# Patient Record
Sex: Male | Born: 1985 | Race: White | Hispanic: No | Marital: Married | Smoking: Former smoker
Health system: Southern US, Community
[De-identification: ages and names within clinical notes are randomized; demographics above are authoritative.]

## PROBLEM LIST (undated history)

## (undated) DIAGNOSIS — Z87442 Personal history of urinary calculi: Secondary | ICD-10-CM

## (undated) DIAGNOSIS — G43909 Migraine, unspecified, not intractable, without status migrainosus: Secondary | ICD-10-CM

## (undated) DIAGNOSIS — F419 Anxiety disorder, unspecified: Secondary | ICD-10-CM

## (undated) DIAGNOSIS — I491 Atrial premature depolarization: Secondary | ICD-10-CM

## (undated) DIAGNOSIS — N200 Calculus of kidney: Secondary | ICD-10-CM

## (undated) DIAGNOSIS — F3289 Other specified depressive episodes: Secondary | ICD-10-CM

## (undated) DIAGNOSIS — S060X9A Concussion with loss of consciousness of unspecified duration, initial encounter: Secondary | ICD-10-CM

## (undated) DIAGNOSIS — S060XAA Concussion with loss of consciousness status unknown, initial encounter: Secondary | ICD-10-CM

## (undated) DIAGNOSIS — F329 Major depressive disorder, single episode, unspecified: Secondary | ICD-10-CM

## (undated) HISTORY — DX: Major depressive disorder, single episode, unspecified: F32.9

## (undated) HISTORY — DX: Calculus of kidney: N20.0

## (undated) HISTORY — PX: CHOLECYSTECTOMY: SHX55

## (undated) HISTORY — PX: SURGERY SCROTAL / TESTICULAR: SUR1316

## (undated) HISTORY — PX: APPENDECTOMY: SHX54

## (undated) HISTORY — DX: Concussion with loss of consciousness of unspecified duration, initial encounter: S06.0X9A

## (undated) HISTORY — DX: Other specified depressive episodes: F32.89

---
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
## 2009-04-13 ENCOUNTER — Emergency Department (HOSPITAL_COMMUNITY): Admission: EM | Admit: 2009-04-13 | Discharge: 2009-04-13 | Payer: Self-pay | Admitting: Emergency Medicine

## 2009-07-05 ENCOUNTER — Observation Stay (HOSPITAL_COMMUNITY): Admission: EM | Admit: 2009-07-05 | Discharge: 2009-07-06 | Payer: Self-pay | Admitting: Emergency Medicine

## 2009-09-03 HISTORY — PX: APPENDECTOMY: SHX54

## 2010-05-20 ENCOUNTER — Emergency Department (HOSPITAL_COMMUNITY): Admission: EM | Admit: 2010-05-20 | Discharge: 2010-05-20 | Payer: Self-pay | Admitting: Emergency Medicine

## 2010-10-27 ENCOUNTER — Encounter: Payer: Self-pay | Admitting: Internal Medicine

## 2010-10-27 ENCOUNTER — Other Ambulatory Visit: Payer: Self-pay | Admitting: Internal Medicine

## 2010-10-27 ENCOUNTER — Ambulatory Visit (INDEPENDENT_AMBULATORY_CARE_PROVIDER_SITE_OTHER): Payer: Self-pay | Admitting: Internal Medicine

## 2010-10-27 DIAGNOSIS — R5381 Other malaise: Secondary | ICD-10-CM

## 2010-10-27 DIAGNOSIS — F329 Major depressive disorder, single episode, unspecified: Secondary | ICD-10-CM | POA: Insufficient documentation

## 2010-10-27 DIAGNOSIS — R5383 Other fatigue: Secondary | ICD-10-CM

## 2010-10-27 DIAGNOSIS — R7309 Other abnormal glucose: Secondary | ICD-10-CM | POA: Insufficient documentation

## 2010-10-27 DIAGNOSIS — E291 Testicular hypofunction: Secondary | ICD-10-CM

## 2010-10-27 DIAGNOSIS — F3289 Other specified depressive episodes: Secondary | ICD-10-CM | POA: Insufficient documentation

## 2010-10-27 LAB — CONVERTED CEMR LAB
Testosterone Free: 79 pg/mL (ref 47.0–244.0)
Testosterone-% Free: 2 % (ref 1.6–2.9)
Testosterone: 390.11 ng/dL (ref 250–890)

## 2010-10-31 LAB — CONVERTED CEMR LAB: Vit D, 25-Hydroxy: 25 ng/mL — ABNORMAL LOW (ref 30–89)

## 2010-10-31 NOTE — Assessment & Plan Note (Signed)
Summary: for testosterone level check   Vital Signs:  Patient profile:   25 year old male Height:      69.5 inches Weight:      215.38 pounds BMI:     31.46 Pulse rate:   80 / minute Pulse rhythm:   regular BP sitting:   122 / 86  (left arm) Cuff size:   regular  Vitals Entered By: Army Fossa CMA (October 27, 2010 11:27 AM) CC: New to est- check testerone level- fasting  Comments [employer]er, [employer] [address]  declines tdap   History of Present Illness:  new patient   6 month history of very low energy , severely decrease libido , generalized weakness. [facility] provided  did some blood work, reportedly the BMP, TSH, were normal.   cholesterol was slightly elevated,  A1 C. was 5.6   was recommended to have a  testosterone level checked.  ROS  denies chest pain or shortness of breath No LE edama  his weight has increase here lately  the need of shaving has not changed, he never require frequent shaving.  he takes multiple medications for depression, no new medications, his depression is relatively well-controlled  (reports  it has been never 100% controlled)  Preventive Screening-Counseling & Management  Alcohol-Tobacco     Smoking Status: quit  Caffeine-Diet-Exercise     Does Patient Exercise: yes  Past History:  Family History: Last updated: 10/27/2010 High cholesterol-- + DM--no CAD-- GF MI in his late 22s  colon ca--no prostate ca--no  Social History: Last updated: 10/27/2010 Divorced 2011 living w/  parents  1 child  Former Smoker Alcohol use-yes Regular exercise-yes occupation-- unemployed education --HS plus almost 4 years of college  Risk Factors: Exercise: yes (10/27/2010)  Risk Factors: Smoking Status: quit (10/27/2010)  Past Medical History: Depression, chronic, moderate-severe, sees Mental Health  Past Surgical History:  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   slightly overweight Neck:  no masses and no thyromegaly.   Lungs:  normal respiratory effort, no intercostal retractions, no accessory muscle use, normal breath sounds, and no dullness.   Heart:  normal rate, regular rhythm, no murmur, and no gallop.   Abdomen:  soft, non-tender, no distention, and no inguinal hernia.   Genitalia:  circumcised, no hydrocele, no varicocele, and no scrotal masses.   right testicle is about half of the size compared to the left. no mass or tenderness.  No  lesions or urethral discharge   Impression & Recommendations:  Problem # 1:  FATIGUE (ICD-780.79)   fatigue and decreased libido  for 6 months  He takes a number of medications that could account for these issues however his medications have not changed in a while.  plan  patient will bring copies of all the previous labs  check testosterone level, vitamin D, B12. reassess in 6 months  Orders: Venipuncture (16109) TLB-B12 + Folate Pnl (82746_82607-B12/FOL) T-Vitamin D (25-Hydroxy) (60454-09811) Specimen Handling (91478)  Problem # 2:  DIABETES MELLITUS, BORDERLINE (ICD-790.29)  A1c  recently was 5.6. Encourage exercise, diet and lose weight. Information provided. Reassess in 6 months  Problem # 3:  ? of HYPOGONADISM (ICD-257.2) see #1 has a small R testicle---STE encouraged   Complete Medication List: 1)  Celexa 40 Mg Tabs (Citalopram hydrobromide) .Marland Kitchen.. 1 by mouth qd 2)  Novne 2mg   .... Once daily 3)  Cogentin  .... Unsure of dosage 4)  Klonopin  0.5 Mg Tabs (Clonazepam) .Marland Kitchen.. 1 as needed 5)  Energy Booster Pack (Multiple vitamins-minerals) .... Two times a day 6)  Remeron 15 Mg Tabs (Mirtazapine) .Marland Kitchen.. 1 by mouth at bedtime  Patient Instructions: 1)  Please schedule a follow-up appointment in 6 months .    Orders Added: 1)  Venipuncture [36415] 2)  TLB-B12 + Folate Pnl [82746_82607-B12/FOL] 3)  T-Vitamin D (25-Hydroxy) [16109-60454] 4)  Specimen Handling [99000] 5)  New Patient Level II [99202]     Risk Factors:  Tobacco use:  quit Alcohol use:  yes Exercise:  yes

## 2010-11-05 IMAGING — CR DG ABDOMEN ACUTE W/ 1V CHEST
4 series · 4 of 4 positions shown · non-contrast
Comparison: None

CLINICAL DATA: Severe right-sided abdominal pain, appendectomy 2
weeks ago

ACUTE ABDOMEN SERIES (ABDOMEN 2 VIEW & CHEST 1 VIEW)

[w chest pa]
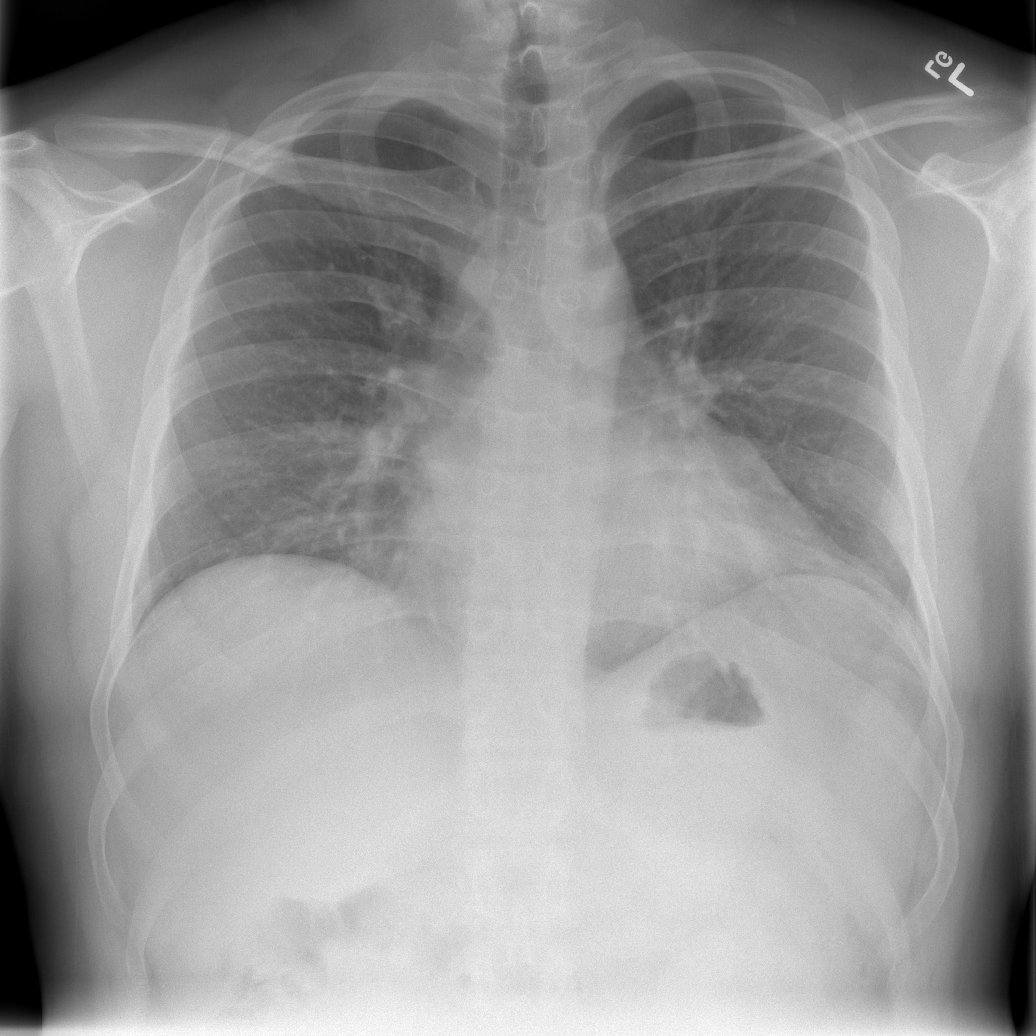

[w abdomen upright *]
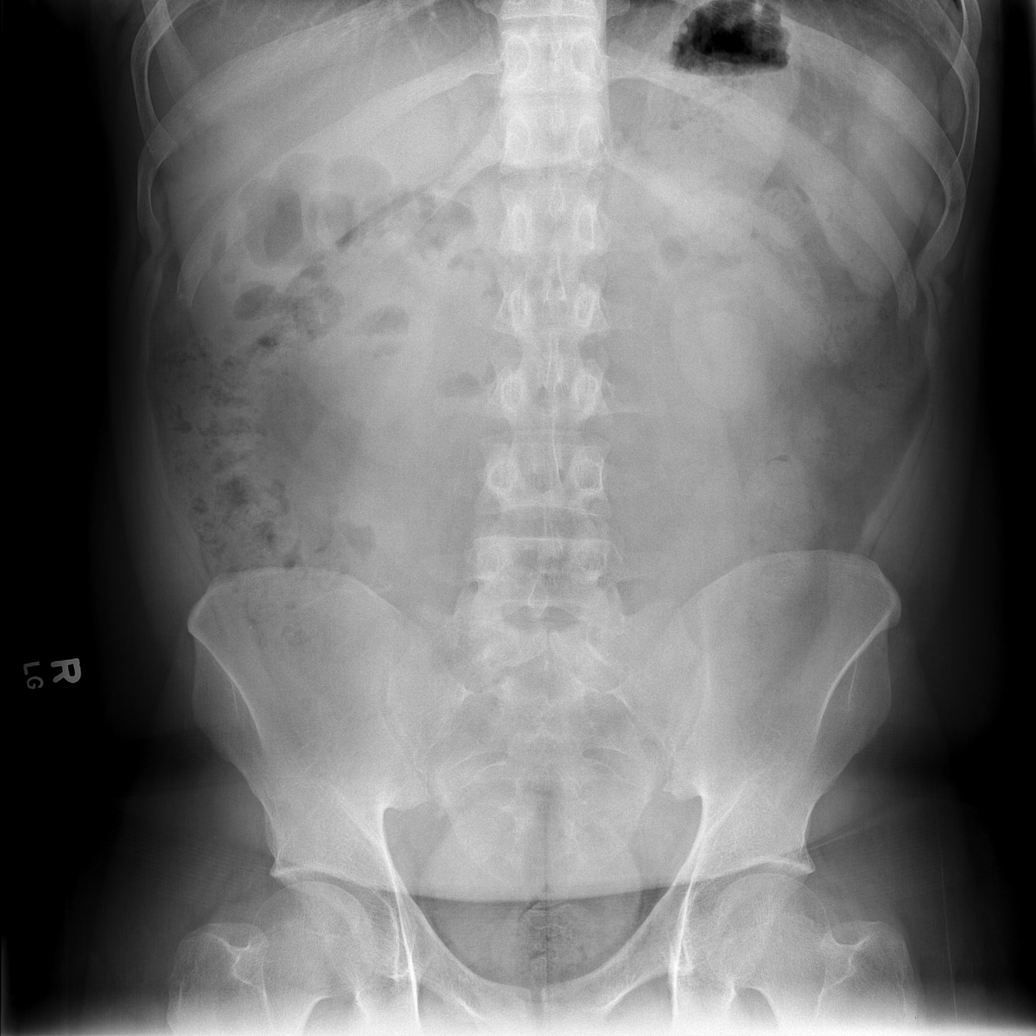

[t abdomen supine (1 of 2)]
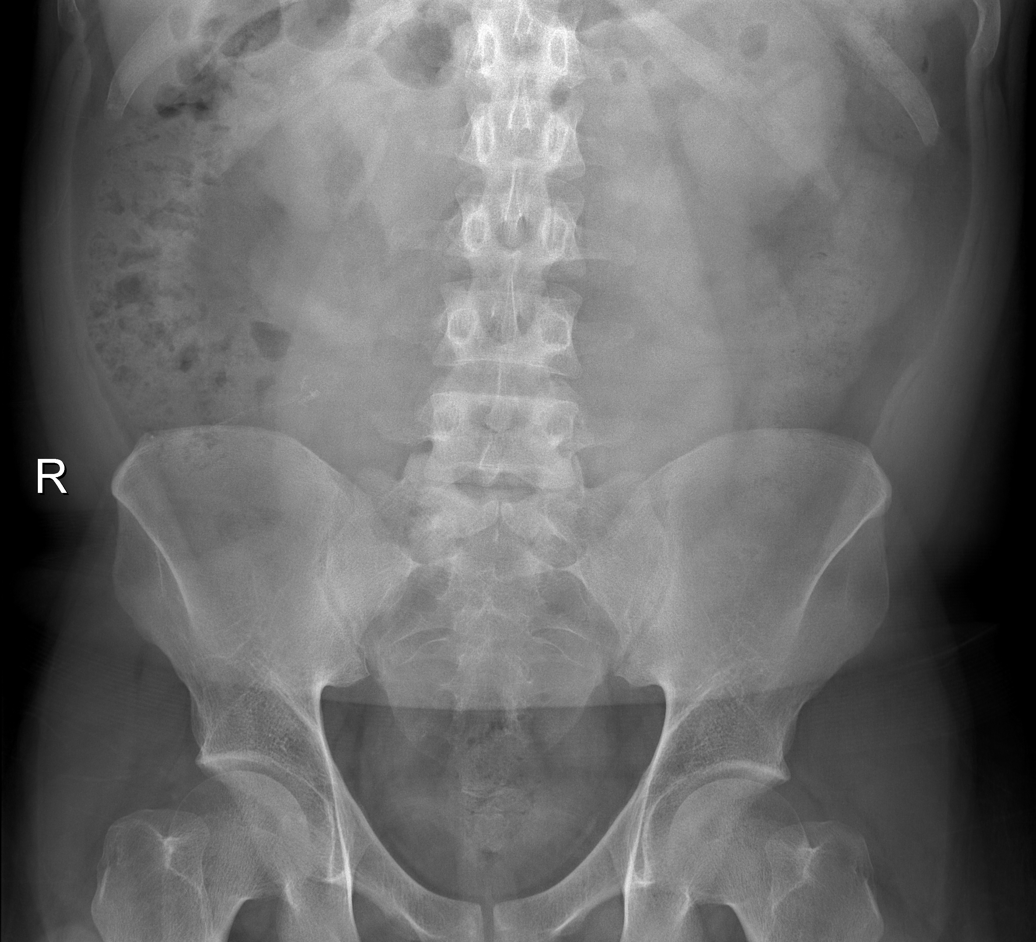

[t abdomen supine (2 of 2)]
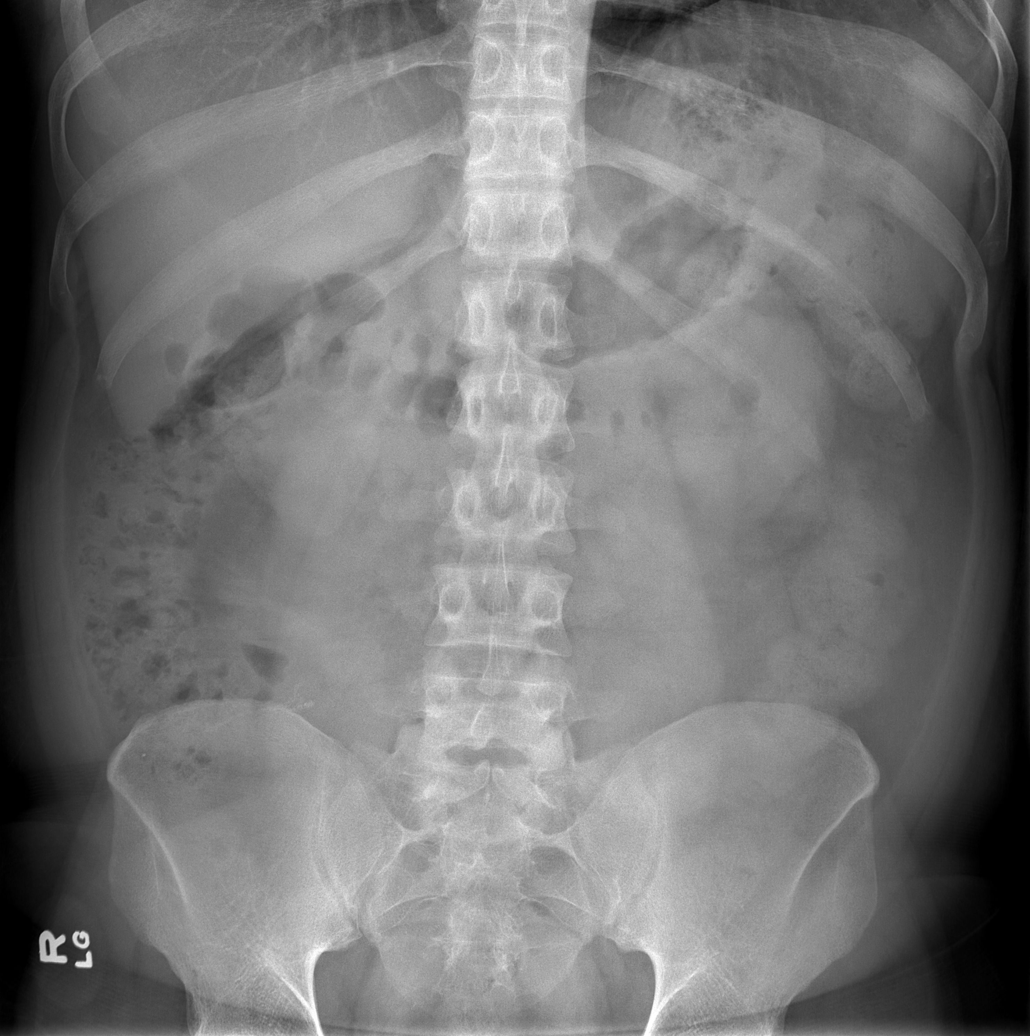

[4 of 4 positions shown; findings below may reference images not displayed]

FINDINGS: The lungs are clear.  The heart is within upper limits of
normal.  No bony abnormality is seen.

Supine and erect views of the abdomen show no bowel obstruction.
No free air is seen.  Surgical sutures are noted in the right lower
quadrant most likely due to recent appendectomy.  No opaque
calculus is seen.
IMPRESSION: 1.  No active lung disease.
2.  No bowel obstruction.  No free air.

## 2010-11-16 LAB — DIFFERENTIAL
Basophils Absolute: 0 10*3/uL (ref 0.0–0.1)
Eosinophils Relative: 3 % (ref 0–5)
Lymphocytes Relative: 26 % (ref 12–46)
Neutro Abs: 5.8 10*3/uL (ref 1.7–7.7)
Neutrophils Relative %: 65 % (ref 43–77)

## 2010-11-16 LAB — BASIC METABOLIC PANEL
BUN: 5 mg/dL — ABNORMAL LOW (ref 6–23)
Calcium: 9 mg/dL (ref 8.4–10.5)
Creatinine, Ser: 0.87 mg/dL (ref 0.4–1.5)
GFR calc non Af Amer: >60 mL/min (ref >60)

## 2010-11-16 LAB — CBC
Platelets: 233 10*3/uL (ref 150–400)
RDW: 13 % (ref 11.5–15.5)
WBC: 9 10*3/uL (ref 4.0–10.5)

## 2010-12-09 LAB — DIFFERENTIAL
Lymphocytes Relative: 42 % (ref 12–46)
Monocytes Absolute: 0.7 10*3/uL (ref 0.1–1.0)
Monocytes Relative: 7 % (ref 3–12)
Neutro Abs: 4.5 10*3/uL (ref 1.7–7.7)

## 2010-12-09 LAB — URINALYSIS, ROUTINE W REFLEX MICROSCOPIC
Ketones, ur: NEGATIVE mg/dL
Leukocytes, UA: NEGATIVE
Nitrite: NEGATIVE
Specific Gravity, Urine: 1.015 (ref 1.005–1.030)
pH: 6.5 (ref 5.0–8.0)

## 2010-12-09 LAB — CBC
HCT: 45.7 % (ref 39.0–52.0)
MCHC: 33.7 g/dL (ref 30.0–36.0)
MCV: 90.2 fL (ref 78.0–100.0)
Platelets: 249 10*3/uL (ref 150–400)
RDW: 13.4 % (ref 11.5–15.5)

## 2010-12-09 LAB — COMPREHENSIVE METABOLIC PANEL
Albumin: 4.1 g/dL (ref 3.5–5.2)
BUN: 12 mg/dL (ref 6–23)
Calcium: 9.8 mg/dL (ref 8.4–10.5)
Creatinine, Ser: 0.92 mg/dL (ref 0.4–1.5)
Total Protein: 7.6 g/dL (ref 6.0–8.3)

## 2010-12-09 LAB — POCT I-STAT, CHEM 8
Calcium, Ion: 1.21 mmol/L (ref 1.12–1.32)
Chloride: 100 mEq/L (ref 96–112)
Glucose, Bld: 92 mg/dL (ref 70–99)
HCT: 48 % (ref 39.0–52.0)

## 2010-12-09 LAB — URINE MICROSCOPIC-ADD ON

## 2013-04-28 ENCOUNTER — Encounter (HOSPITAL_COMMUNITY): Payer: Self-pay | Admitting: Emergency Medicine

## 2013-04-28 ENCOUNTER — Emergency Department (HOSPITAL_COMMUNITY)
Admission: EM | Admit: 2013-04-28 | Discharge: 2013-04-28 | Disposition: A | Discharge status: Home / Self Care | Payer: Self-pay | Attending: Emergency Medicine | Admitting: Emergency Medicine

## 2013-04-28 ENCOUNTER — Emergency Department (HOSPITAL_COMMUNITY): Payer: Self-pay

## 2013-04-28 DIAGNOSIS — R5381 Other malaise: Secondary | ICD-10-CM | POA: Insufficient documentation

## 2013-04-28 DIAGNOSIS — R002 Palpitations: Secondary | ICD-10-CM | POA: Insufficient documentation

## 2013-04-28 DIAGNOSIS — I4949 Other premature depolarization: Secondary | ICD-10-CM | POA: Insufficient documentation

## 2013-04-28 DIAGNOSIS — Z79899 Other long term (current) drug therapy: Secondary | ICD-10-CM | POA: Insufficient documentation

## 2013-04-28 DIAGNOSIS — R42 Dizziness and giddiness: Secondary | ICD-10-CM | POA: Insufficient documentation

## 2013-04-28 DIAGNOSIS — Z87891 Personal history of nicotine dependence: Secondary | ICD-10-CM | POA: Insufficient documentation

## 2013-04-28 DIAGNOSIS — I493 Ventricular premature depolarization: Secondary | ICD-10-CM

## 2013-04-28 HISTORY — DX: Atrial premature depolarization: I49.1

## 2013-04-28 LAB — CBC
Hemoglobin: 15.9 g/dL (ref 13.0–17.0)
MCH: 30.1 pg (ref 26.0–34.0)
MCV: 85.8 fL (ref 78.0–100.0)
RBC: 5.29 MIL/uL (ref 4.22–5.81)

## 2013-04-28 LAB — POCT I-STAT TROPONIN I: Troponin i, poc: 0 ng/mL (ref 0.00–0.08)

## 2013-04-28 LAB — BASIC METABOLIC PANEL
CO2: 28 mEq/L (ref 19–32)
Calcium: 9.8 mg/dL (ref 8.4–10.5)
Chloride: 99 mEq/L (ref 96–112)
Creatinine, Ser: 0.84 mg/dL (ref 0.50–1.35)
Glucose, Bld: 98 mg/dL (ref 70–99)
Sodium: 137 mEq/L (ref 135–145)

## 2013-04-28 NOTE — ED Notes (Signed)
Pt c/o irregular heart beat and increasing dizziness x3 hours.  Reports hx of PAC.

## 2013-04-28 NOTE — ED Provider Notes (Signed)
CSN: [id number]     Arrival date & time 04/28/13  1303 History   First MD Initiated Contact with Patient 04/28/13 1503     Chief Complaint  Patient presents with  . Irregular Heart Beat  . Dizziness   (Consider location/radiation/quality/duration/timing/severity/associated sxs/prior Treatment) HPI Patient is a 27 year old male who presented to emergency department complaining of weakness, dizziness, palpitations. Patient states his symptoms began a few days ago but worsened today. Patient states he feels like his heart is skipping. Patient states he has history of palpitations in the past, and states was seen in the hospital was told he has PACs. Patient states at that time he was started to be related to his illicit drug use. 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 States that he went to rehabilitation and denies taking any prescribed or nonprescribed medications. Patient also denies any drug use, alcohol, smoking. Patient admits to taking a multivitamin and fish oil daily. Patient states he drinks about 28 ounces coffee cups a day. Patient denies any stress or history of anxiety. Past Medical History  Diagnosis Date  . PAC (premature atrial contraction)    History reviewed. No pertinent past surgical history. History reviewed. No pertinent family history. History  Substance Use Topics  . Smoking status: Former Games developer  . Smokeless tobacco: Not on file  . Alcohol Use: No    Review of Systems  Constitutional: Negative for fever and chills.  HENT: Negative for neck pain and neck stiffness.   Respiratory: Negative for cough, chest tightness and shortness of breath.   Cardiovascular: Positive for palpitations. Negative for chest pain and leg swelling.  Gastrointestinal: Negative for nausea, vomiting, abdominal pain, diarrhea and abdominal distention.  Genitourinary: Negative for dysuria, urgency, frequency and hematuria.  Musculoskeletal: Negative for myalgias and  arthralgias.  Skin: Negative for rash.  Allergic/Immunologic: Negative for immunocompromised state.  Neurological: Positive for dizziness, weakness and light-headedness. Negative for numbness and headaches.    Allergies  Review of patient's allergies indicates no known allergies.  Home Medications   Current Outpatient Rx  Name  Route  Sig  Dispense  Refill  . fish oil-omega-3 fatty acids 1000 MG capsule   Oral   Take 2 g by mouth daily.         Marland Kitchen ibuprofen (ADVIL,MOTRIN) 200 MG tablet   Oral   Take 400 mg by mouth every 6 (six) hours as needed for pain.         . Multiple Vitamin (MULTIVITAMIN WITH MINERALS) TABS tablet   Oral   Take 1 tablet by mouth daily.          BP 153/91  Pulse 61  Resp 16  Ht 5\' 10"  (1.778 m)  Wt 200 lb (90.719 kg)  BMI 28.7 kg/m2  SpO2 98% Physical Exam  Nursing note and vitals reviewed. Constitutional: He appears well-developed and well-nourished. No distress.  HENT:  Head: Normocephalic and atraumatic.  Eyes: Conjunctivae are normal.  Neck: Neck supple.  Cardiovascular: Normal rate, regular rhythm and normal heart sounds.   Pulmonary/Chest: Effort normal. No respiratory distress. He has no wheezes. He has no rales.  Abdominal: Soft. Bowel sounds are normal. He exhibits no distension. There is no tenderness. There is no rebound.  Musculoskeletal: He exhibits no edema.  Neurological: He is alert.  Skin: Skin is warm and dry.    ED Course  Procedures (including critical care time) Labs Review Results for orders placed during the hospital encounter of 04/28/13  CBC      Result Value Range   WBC 5.9  4.0 - 10.5 K/uL   RBC 5.29  4.22 - 5.81 MIL/uL   Hemoglobin 15.9  13.0 - 17.0 g/dL   HCT 14.7  82.9 - 56.2 %   MCV 85.8  78.0 - 100.0 fL   MCH 30.1  26.0 - 34.0 pg   MCHC 35.0  30.0 - 36.0 g/dL   RDW 13.0  86.5 - 78.4 %   Platelets 200  150 - 400 K/uL  BASIC METABOLIC PANEL      Result Value Range   Sodium 137  135 - 145 mEq/L    Potassium 3.8  3.5 - 5.1 mEq/L   Chloride 99  96 - 112 mEq/L   CO2 28  19 - 32 mEq/L   Glucose, Bld 98  70 - 99 mg/dL   BUN 13  6 - 23 mg/dL   Creatinine, Ser 6.96  0.50 - 1.35 mg/dL   Calcium 9.8  8.4 - 29.5 mg/dL   GFR calc non Af Amer >90  >90 mL/min   GFR calc Af Amer >90  >90 mL/min  MAGNESIUM      Result Value Range   Magnesium 2.2  1.5 - 2.5 mg/dL  POCT I-STAT TROPONIN I      Result Value Range   Troponin i, poc 0.00  0.00 - 0.08 ng/mL   Comment 3            Imaging Review Dg Chest Port 1 View  04/28/2013   *RADIOLOGY REPORT*  Clinical Data: Irregular heart beat.  Dizziness  PORTABLE CHEST - 1 VIEW  Comparison: None.  Findings: Lungs are clear.  Negative for heart failure.  Negative for infiltrate effusion or mass lesion.  IMPRESSION: No active cardiopulmonary abnormality.   Original Report Authenticated By: Janeece Riggers, M.D.    MDM   1. PVCs (premature ventricular contractions)   2. Palpitations   3. Dizziness    Patient with palpitations and dizziness for several days. No drugs or alcohol. History of the same. EKG unremarkable full showing sinus rhythm, 1 PVC. On the monitor patient does have intermittent PVCs otherwise normal sinus rhythm. Labs as above and are normal. I do suspect patient's symptoms are due to his PVCs. At this time no signs of a cardiac problem, VS normal, pt non toxic appearing. Will d/c home with cardiology follow up. Pt agrees with plan. Instructed to avoid caffeine drinks and to return to ER if worsening.         Lottie Mussel, PA-C 04/28/13 1740

## 2013-04-28 NOTE — Progress Notes (Signed)
EDCM approached by ER RN regarding resources for health insurance for patient.  EDCM provided list of pcps who accept selfpay patients, list of discount pharmacies and needymeds.org for medication assistance, information about Medicaid and Affordable Care Act for insurance, and financial assistance in the community such as [employer].  [facility] representative also saw patient and provided information regarding the orange card.

## 2013-04-28 NOTE — ED Provider Notes (Signed)
EKG: Indications ingestion. Interpretation sinus rhythm no interval changes no ST or T wave changes  Claudean Kinds, MD 04/28/13 1523

## 2013-04-28 NOTE — Progress Notes (Signed)
[facility] CL provided patient with a list of primary care resources in [place] and a [facility]-Orange Card application.  °

## 2013-04-29 NOTE — ED Provider Notes (Signed)
Medical screening examination/treatment/procedure(s) were performed by non-physician practitioner and as supervising physician I was immediately available for consultation/collaboration.  Shon Baton, MD 04/29/13 [phone]

## 2013-05-05 ENCOUNTER — Encounter: Payer: Self-pay | Admitting: *Deleted

## 2013-05-05 ENCOUNTER — Encounter: Payer: Self-pay | Admitting: Cardiology

## 2013-05-06 ENCOUNTER — Encounter: Payer: Self-pay | Admitting: Cardiology

## 2013-05-06 ENCOUNTER — Ambulatory Visit (INDEPENDENT_AMBULATORY_CARE_PROVIDER_SITE_OTHER): Payer: Self-pay | Admitting: Cardiology

## 2013-05-06 VITALS — BP 139/78 | HR 77 | Ht 70.0 in | Wt 193.8 lb

## 2013-05-06 DIAGNOSIS — R002 Palpitations: Secondary | ICD-10-CM

## 2013-05-06 DIAGNOSIS — F329 Major depressive disorder, single episode, unspecified: Secondary | ICD-10-CM

## 2013-05-06 MED ORDER — METOPROLOL SUCCINATE ER 25 MG PO TB24: 25.0000 mg | ORAL_TABLET | Freq: Every day | ORAL | Status: DC

## 2013-05-06 NOTE — Patient Instructions (Signed)
Your physician recommends that you schedule a follow-up appointment in: 12 WEEKS WITH DR Jens Som  Your physician has requested that you have an echocardiogram. Echocardiography is a painless test that uses sound waves to create images of your heart. It provides your doctor with information about the size and shape of your heart and how well your heart's chambers and valves are working. This procedure takes approximately one hour. There are no restrictions for this procedure.   Your physician recommends that you HAVE LAB WORK TODAY  START METOPROLOL SUCC 25 MG ONCE DAILY

## 2013-05-06 NOTE — Assessment & Plan Note (Signed)
Management per primary care. 

## 2013-05-06 NOTE — Progress Notes (Signed)
  HPI: 27 year old male for evaluation of palpitations. Seen in the emergency room on August 26 with complaints of palpitations. Troponin negative. Magnesium 2.2. Hemoglobin 15.9. Potassium 3.8. Chest x-ray negative. Patient noted to have PVCs on telemetry and referred to cardiology. Patient states he had palpitations approximately 3 years ago related to PACs. He has recently noticed increasing palpitations again. These are described as a skip. There is associated dizziness and dyspnea but no chest pain. He has not had syncope. He otherwise denies dyspnea on exertion, orthopnea, PND, pedal edema, exertional chest pain. Of note he did have symptoms in the emergency room when he was found to have PVCs on telemetry.  Current Outpatient Prescriptions  Medication Sig Dispense Refill  . fish oil-omega-3 fatty acids 1000 MG capsule Take 2 g by mouth daily.      Marland Kitchen ibuprofen (ADVIL,MOTRIN) 200 MG tablet Take 400 mg by mouth every 6 (six) hours as needed for pain.      . Multiple Vitamin (MULTIVITAMIN WITH MINERALS) TABS tablet Take 1 tablet by mouth daily.       No current facility-administered medications for this visit.    No Known Allergies  Past Medical History  Diagnosis Date  . PAC (premature atrial contraction)   . DEPRESSION   . Nephrolithiasis     Past Surgical History  Procedure Laterality Date  . Appendectomy    . Surgery scrotal / testicular      History   Social History  . Marital Status: Divorced    Spouse Name: N/A    Number of Children: 1  . Years of Education: N/A   Occupational History  . Not on file.   Social History Main Topics  . Smoking status: Former Games developer  . Smokeless tobacco: Not on file  . Alcohol Use: No  . Drug Use: No     Comment: Previous substance abuse  . Sexual Activity: Not on file   Other Topics Concern  . Not on file   Social History Narrative  . No narrative on file    Family History  Problem Relation Age of Onset  . Heart disease  Paternal Grandfather     ROS: no fevers or chills, productive cough, hemoptysis, dysphasia, odynophagia, melena, hematochezia, dysuria, hematuria, rash, seizure activity, orthopnea, PND, pedal edema, claudication. Remaining systems are negative.  Physical Exam:   Blood pressure 139/78, pulse 77, height 5\' 10"  (1.778 m), weight 193 lb 12.8 oz (87.907 kg).  General:  Well developed/well nourished in NAD Skin warm/dry Patient not depressed No peripheral clubbing Back-normal HEENT-normal/normal eyelids Neck supple/normal carotid upstroke bilaterally; no bruits; no JVD; no thyromegaly chest - CTA/ normal expansion CV - RRR/normal S1 and S2; no murmurs, rubs or gallops;  PMI nondisplaced Abdomen -NT/ND, no HSM, no mass, + bowel sounds, no bruit 2+ femoral pulses, no bruits Ext-no edema, chords, 2+ DP Neuro-grossly nonfocal  ECG 04/28/2013-sinus arrhythmia with early repolarization abnormality.

## 2013-05-06 NOTE — Assessment & Plan Note (Addendum)
Symptoms sound to be related to PACs and PVCs as he had these in the emergency room with documentation of PVCs on telemetry. He also has some dyspnea with the symptoms. Schedule echocardiogram to quantify LV function. Check TSH. Add Toprol 25 mg daily. Note potassium and magnesium normal.

## 2013-05-14 ENCOUNTER — Ambulatory Visit (HOSPITAL_COMMUNITY): Payer: Self-pay | Attending: Cardiology | Admitting: Radiology

## 2013-05-14 DIAGNOSIS — Z87891 Personal history of nicotine dependence: Secondary | ICD-10-CM | POA: Insufficient documentation

## 2013-05-14 DIAGNOSIS — R0609 Other forms of dyspnea: Secondary | ICD-10-CM | POA: Insufficient documentation

## 2013-05-14 DIAGNOSIS — R0989 Other specified symptoms and signs involving the circulatory and respiratory systems: Secondary | ICD-10-CM | POA: Insufficient documentation

## 2013-05-14 DIAGNOSIS — R7309 Other abnormal glucose: Secondary | ICD-10-CM | POA: Insufficient documentation

## 2013-05-14 DIAGNOSIS — R002 Palpitations: Secondary | ICD-10-CM

## 2013-05-14 NOTE — Progress Notes (Signed)
Echocardiogram performed.  

## 2013-05-15 ENCOUNTER — Telehealth: Payer: Self-pay | Admitting: Cardiology

## 2013-05-15 NOTE — Telephone Encounter (Signed)
Received call from patient stated he needed a note from Dr.Crenshaw to return to [facility].Stated needed note asap.Patient stated to call father on Monday 05/18/13 cell # [phone].Stated he will pick note up.Message sent to Sherri Rad RN she will be working with Dr.Crenshaw on Monday 05/18/13.

## 2013-05-15 NOTE — Telephone Encounter (Signed)
New Problem  Needs a note from the Dr. to return to [facility]// left the note at front office// checking on the status.

## 2013-05-15 NOTE — Telephone Encounter (Signed)
Returned call to patient no answer.LMTC. 

## 2013-05-18 ENCOUNTER — Encounter: Payer: Self-pay | Admitting: *Deleted

## 2013-05-18 NOTE — Telephone Encounter (Signed)
Reviewed with Dr. Jens Som. The patient may return to [facility]. I have done a letter for the patient. I have left a message for the patient's father to call at the number listed.

## 2013-05-18 NOTE — Telephone Encounter (Signed)
I spoke with the patient's father. He is aware of echo results and that the letter is at the front desk.

## 2013-05-20 ENCOUNTER — Encounter: Payer: Self-pay | Admitting: Cardiology

## 2013-06-08 ENCOUNTER — Encounter: Payer: Self-pay | Admitting: Cardiovascular Disease

## 2013-08-03 ENCOUNTER — Encounter: Payer: Self-pay | Admitting: Cardiology

## 2013-08-03 NOTE — Progress Notes (Signed)
      HPI: FU palpitations. Seen in the emergency room on August 26 with complaints of palpitations. Troponin negative. Magnesium 2.2. Hemoglobin 15.9. Potassium 3.8. Chest x-ray negative. Patient noted to have PVCs on telemetry and referred to cardiology. Echocardiogram in September of 2014 showed normal LV function. TSH normal. When I saw him previously we added Toprol 25 mg daily. Since then,    Current Outpatient Prescriptions  Medication Sig Dispense Refill  . fish oil-omega-3 fatty acids 1000 MG capsule Take 2 g by mouth daily.      Marland Kitchen ibuprofen (ADVIL,MOTRIN) 200 MG tablet Take 400 mg by mouth every 6 (six) hours as needed for pain.      . metoprolol succinate (TOPROL XL) 25 MG 24 hr tablet Take 1 tablet (25 mg total) by mouth daily.  30 tablet  12  . Multiple Vitamin (MULTIVITAMIN WITH MINERALS) TABS tablet Take 1 tablet by mouth daily.       No current facility-administered medications for this visit.     Past Medical History  Diagnosis Date  . PAC (premature atrial contraction)   . DEPRESSION   . Nephrolithiasis     Past Surgical History  Procedure Laterality Date  . Appendectomy    . Surgery scrotal / testicular      History   Social History  . Marital Status: Divorced    Spouse Name: N/A    Number of Children: 1  . Years of Education: N/A   Occupational History  . Not on file.   Social History Main Topics  . Smoking status: Former Games developer  . Smokeless tobacco: Not on file  . Alcohol Use: No  . Drug Use: No     Comment: Previous substance abuse  . Sexual Activity: Not on file   Other Topics Concern  . Not on file   Social History Narrative  . No narrative on file    ROS: no fevers or chills, productive cough, hemoptysis, dysphasia, odynophagia, melena, hematochezia, dysuria, hematuria, rash, seizure activity, orthopnea, PND, pedal edema, claudication. Remaining systems are negative.  Physical Exam: Well-developed well-nourished in no acute  distress.  Skin is warm and dry.  HEENT is normal.  Neck is supple.  Chest is clear to auscultation with normal expansion.  Cardiovascular exam is regular rate and rhythm.  Abdominal exam nontender or distended. No masses palpated. Extremities show no edema. neuro grossly intact  ECG     This encounter was created in error - please disregard.

## 2013-08-07 ENCOUNTER — Encounter: Payer: Self-pay | Admitting: Cardiology

## 2013-10-23 ENCOUNTER — Encounter: Payer: Self-pay | Admitting: Cardiology

## 2015-06-16 ENCOUNTER — Emergency Department (HOSPITAL_COMMUNITY)
Admission: EM | Admit: 2015-06-16 | Discharge: 2015-06-16 | Disposition: A | Discharge status: Home / Self Care | Payer: 59 | Attending: Emergency Medicine | Admitting: Emergency Medicine

## 2015-06-16 ENCOUNTER — Encounter (HOSPITAL_COMMUNITY): Payer: Self-pay | Admitting: Emergency Medicine

## 2015-06-16 DIAGNOSIS — Z79899 Other long term (current) drug therapy: Secondary | ICD-10-CM | POA: Insufficient documentation

## 2015-06-16 DIAGNOSIS — Z87442 Personal history of urinary calculi: Secondary | ICD-10-CM | POA: Diagnosis not present

## 2015-06-16 DIAGNOSIS — R5383 Other fatigue: Secondary | ICD-10-CM | POA: Diagnosis present

## 2015-06-16 DIAGNOSIS — Z8679 Personal history of other diseases of the circulatory system: Secondary | ICD-10-CM | POA: Insufficient documentation

## 2015-06-16 DIAGNOSIS — R42 Dizziness and giddiness: Secondary | ICD-10-CM | POA: Diagnosis not present

## 2015-06-16 DIAGNOSIS — Z8659 Personal history of other mental and behavioral disorders: Secondary | ICD-10-CM | POA: Diagnosis not present

## 2015-06-16 DIAGNOSIS — Z87891 Personal history of nicotine dependence: Secondary | ICD-10-CM | POA: Insufficient documentation

## 2015-06-16 LAB — URINALYSIS, ROUTINE W REFLEX MICROSCOPIC
BILIRUBIN URINE: NEGATIVE
Glucose, UA: NEGATIVE mg/dL
HGB URINE DIPSTICK: NEGATIVE
KETONES UR: NEGATIVE mg/dL
Leukocytes, UA: NEGATIVE
Nitrite: NEGATIVE
Protein, ur: NEGATIVE mg/dL
SPECIFIC GRAVITY, URINE: 1.016 (ref 1.005–1.030)
UROBILINOGEN UA: 0.2 mg/dL (ref 0.0–1.0)
pH: 7 (ref 5.0–8.0)

## 2015-06-16 LAB — BASIC METABOLIC PANEL
Anion gap: 9 (ref 5–15)
BUN: 14 mg/dL (ref 6–20)
CALCIUM: 9.6 mg/dL (ref 8.9–10.3)
CO2: 27 mmol/L (ref 22–32)
CREATININE: 0.97 mg/dL (ref 0.61–1.24)
Chloride: 101 mmol/L (ref 101–111)
GFR calc Af Amer: >60 mL/min (ref >60)
GLUCOSE: 107 mg/dL — AB (ref 65–99)
Potassium: 3.9 mmol/L (ref 3.5–5.1)
SODIUM: 137 mmol/L (ref 135–145)

## 2015-06-16 LAB — CBC
HEMATOCRIT: 46.6 % (ref 39.0–52.0)
Hemoglobin: 16.6 g/dL (ref 13.0–17.0)
MCH: 30.3 pg (ref 26.0–34.0)
MCHC: 35.6 g/dL (ref 30.0–36.0)
MCV: 85 fL (ref 78.0–100.0)
PLATELETS: 221 10*3/uL (ref 150–400)
RBC: 5.48 MIL/uL (ref 4.22–5.81)
RDW: 12.1 % (ref 11.5–15.5)
WBC: 5.9 10*3/uL (ref 4.0–10.5)

## 2015-06-16 LAB — CBG MONITORING, ED: GLUCOSE-CAPILLARY: 123 mg/dL — AB (ref 65–99)

## 2015-06-16 NOTE — ED Provider Notes (Signed)
CSN: [id number]     Arrival date & time 06/16/15  1338 History   First MD Initiated Contact with Patient 06/16/15 1456     Chief Complaint  Patient presents with  . Fatigue     (Consider location/radiation/quality/duration/timing/severity/associated sxs/prior Treatment) HPI Comments: 29 year old male with a history of PACs, depression, kidney stones who presents with nausea, lightheadedness, weakness. 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 He later began feeling lightheaded and since then has had nausea, generalized weakness, lightheadedness, dizziness, and mild headache. He denies any fevers, vomiting, abdominal pain, cough/cold symptoms, or recent illness. Eating and drinking normally. No sudden onset of severe headache. No rash. No tick exposure. No sick contacts or recent travel. He endorses some occasional mild shortness of breath w/ his sx which feels like previous episodes of anxiety. No personal or family history of unprovoked blood clots.  The history is provided by the patient.    Past Medical History  Diagnosis Date  . PAC (premature atrial contraction)   . DEPRESSION   . Nephrolithiasis    Past Surgical History  Procedure Laterality Date  . Appendectomy    . Surgery scrotal / testicular     Family History  Problem Relation Age of Onset  . Heart disease Paternal Grandfather    Social History  Substance Use Topics  . Smoking status: Former Games developermoker  . Smokeless tobacco: None  . Alcohol Use: No    Review of Systems  10 Systems reviewed and are negative for acute change except as noted in the HPI.   Allergies  Review of patient's allergies indicates no known allergies.  Home Medications   Prior to Admission medications   Medication Sig Start Date End Date Taking? Authorizing Provider  fish oil-omega-3 fatty acids 1000 MG capsule Take 2 g by mouth daily.    Yes Historical Provider, MD  ibuprofen (ADVIL,MOTRIN) 200 MG tablet Take 400 mg by mouth every 6 (six) hours as needed for pain.   Yes Historical Provider, MD  Multiple Vitamin (MULTIVITAMIN WITH MINERALS) TABS tablet Take 1 tablet by mouth daily.   Yes Historical Provider, MD  OVER THE COUNTER MEDICATION Take 1 tablet by mouth daily. Caffeine/green tea supplement-   Yes Historical Provider, MD  metoprolol succinate (TOPROL XL) 25 MG 24 hr tablet Take 1 tablet (25 mg total) by mouth daily. Patient not taking: Reported on 06/16/2015 05/06/13   Lewayne BuntingBrian S Crenshaw, MD   BP 144/89 mmHg  Pulse 123  Temp(Src) 98 F (36.7 C) (Oral)  Resp 11  SpO2 100% Physical Exam  Constitutional: He is oriented to person, place, and time. He appears well-developed and well-nourished. No distress.  Awake, alert  HENT:  Head: Normocephalic and atraumatic.  Eyes: Conjunctivae and EOM are normal. Pupils are equal, round, and reactive to light.  Neck: Normal range of motion. Neck supple.  Cardiovascular: Normal rate, regular rhythm and normal heart sounds.   No murmur heard. Pulmonary/Chest: Effort normal and breath sounds normal. No respiratory distress.  Abdominal: Soft. Bowel sounds are normal. He exhibits no distension.  Musculoskeletal: He exhibits no edema.  Neurological: He is alert and oriented to person, place, and time. He has normal reflexes. No cranial nerve deficit. He exhibits normal muscle tone.  Fluent speech, normal finger-to-nose testing, negative pronator drift  Skin: Skin is warm and dry.  Psychiatric: He has a normal mood and  affect. Judgment and thought content normal.  Nursing note and vitals reviewed.   ED Course  Procedures (including critical care time) Labs Review Labs Reviewed  BASIC METABOLIC PANEL - Abnormal; Notable for the following:    Glucose, Bld 107 (*)    All other components within normal limits  CBG MONITORING, ED - Abnormal; Notable for the following:     Glucose-Capillary 123 (*)    All other components within normal limits  CBC  URINALYSIS, ROUTINE W REFLEX MICROSCOPIC (NOT AT [facility])    Imaging Review No results found. I have personally reviewed and evaluated these lab results as part of my medical decision-making.   EKG Interpretation   Date/Time:  Thursday June 16 2015 14:10:23 EDT Ventricular Rate:  90 PR Interval:  149 QRS Duration: 92 QT Interval:  344 QTC Calculation: 421 R Axis:   21 Text Interpretation:  Sinus rhythm Baseline wander in lead(s) III V6  Otherwise within normal limits Confirmed by POLLINA  MD, CHRISTOPHER  [phone]) on 06/16/2015 2:13:20 PM      MDM   Final diagnoses:  Lightheadedness   29 year old male who presents with several days of nonspecific symptoms including nausea, lightheadedness, generalized weakness, and mild intermittent headache. Patient well-appearing at presentation. Heart rate was in the 90s during my exam. EKG without abnormalities. Normal neurologic exam. I considered meningitis given his report of neck pain but he has no neck stiffness on exam, no severe headache, and no fever, thus I feel this is very unlikely. No tick or outdoor exposure to suggest tick borne illness. No sudden onset of headache or neurologic deficits to suggest SAH or other acute intracranial process. He later noted that he sometimes has heart racing. He has a history of PACs and was previously on metoprolol but self discontinued the medication a year ago. I've instructed him to follow-up with his cardiologist as he may need to restart that medication. He is not tachycardic, hypoxic, or dyspneic to suggest cardiac or pulmonary etiology of his symptoms. Instructed on supportive care including Tylenol/Motrin as needed and extensively reviewed return precautions including fever, neurologic symptoms, chest pain, or shortness of breath. Patient voiced understanding and was discharged in satisfactory condition.    Laurence Spates, MD 06/16/15 [phone]

## 2015-06-16 NOTE — ED Notes (Signed)
Pt c/o bilateral blurred vision, left sided HA, nausea, lightheadedness, dizziness, generalized weakness x1 day.

## 2015-06-26 NOTE — Progress Notes (Signed)
      HPI: FU palpitations. Seen in the emergency room 8/14 with complaints of palpitations. Of note he did have symptoms in the emergency room when he was found to have PVCs on telemetry. Echo 9/14 showed normal LV function. Toprol added at last OV 9/14. Seen recently in ER with fatigue and palpitations; K normal. DCed toprol on his own. Since last seen,   Current Outpatient Prescriptions  Medication Sig Dispense Refill  . fish oil-omega-3 fatty acids 1000 MG capsule Take 2 g by mouth daily.    Marland Kitchen. ibuprofen (ADVIL,MOTRIN) 200 MG tablet Take 400 mg by mouth every 6 (six) hours as needed for pain.    . metoprolol succinate (TOPROL XL) 25 MG 24 hr tablet Take 1 tablet (25 mg total) by mouth daily. (Patient not taking: Reported on 06/16/2015) 30 tablet 12  . Multiple Vitamin (MULTIVITAMIN WITH MINERALS) TABS tablet Take 1 tablet by mouth daily.    Marland Kitchen. OVER THE COUNTER MEDICATION Take 1 tablet by mouth daily. Caffeine/green tea supplement-     No current facility-administered medications for this visit.     Past Medical History  Diagnosis Date  . PAC (premature atrial contraction)   . DEPRESSION   . Nephrolithiasis     Past Surgical History  Procedure Laterality Date  . Appendectomy    . Surgery scrotal / testicular      Social History   Social History  . Marital Status: Divorced    Spouse Name: N/A  . Number of Children: 1  . Years of Education: N/A   Occupational History  . Not on file.   Social History Main Topics  . Smoking status: Former Games developermoker  . Smokeless tobacco: Not on file  . Alcohol Use: No  . Drug Use: No     Comment: Previous substance abuse  . Sexual Activity: Not on file   Other Topics Concern  . Not on file   Social History Narrative    ROS: no fevers or chills, productive cough, hemoptysis, dysphasia, odynophagia, melena, hematochezia, dysuria, hematuria, rash, seizure activity, orthopnea, PND, pedal edema, claudication. Remaining systems are  negative.  Physical Exam: Well-developed well-nourished in no acute distress.  Skin is warm and dry.  HEENT is normal.  Neck is supple.  Chest is clear to auscultation with normal expansion.  Cardiovascular exam is regular rate and rhythm.  Abdominal exam nontender or distended. No masses palpated. Extremities show no edema. neuro grossly intact  ECG     This encounter was created in error - please disregard.

## 2015-06-28 ENCOUNTER — Encounter: Payer: 59 | Admitting: Cardiology

## 2016-06-30 ENCOUNTER — Encounter (HOSPITAL_COMMUNITY): Payer: Self-pay | Admitting: Nurse Practitioner

## 2016-06-30 ENCOUNTER — Emergency Department (HOSPITAL_COMMUNITY)
Admission: EM | Admit: 2016-06-30 | Discharge: 2016-06-30 | Disposition: A | Discharge status: Home / Self Care | Payer: Managed Care, Other (non HMO) | Attending: Emergency Medicine | Admitting: Emergency Medicine

## 2016-06-30 ENCOUNTER — Emergency Department (HOSPITAL_COMMUNITY): Payer: Managed Care, Other (non HMO)

## 2016-06-30 DIAGNOSIS — Z79899 Other long term (current) drug therapy: Secondary | ICD-10-CM | POA: Diagnosis not present

## 2016-06-30 DIAGNOSIS — R0789 Other chest pain: Secondary | ICD-10-CM | POA: Insufficient documentation

## 2016-06-30 DIAGNOSIS — Z87891 Personal history of nicotine dependence: Secondary | ICD-10-CM | POA: Diagnosis not present

## 2016-06-30 DIAGNOSIS — R079 Chest pain, unspecified: Secondary | ICD-10-CM | POA: Diagnosis present

## 2016-06-30 LAB — BASIC METABOLIC PANEL
ANION GAP: 11 (ref 5–15)
BUN: 12 mg/dL (ref 6–20)
CALCIUM: 9.6 mg/dL (ref 8.9–10.3)
CHLORIDE: 103 mmol/L (ref 101–111)
CO2: 23 mmol/L (ref 22–32)
Creatinine, Ser: 0.92 mg/dL (ref 0.61–1.24)
GFR calc non Af Amer: >60 mL/min (ref >60)
Glucose, Bld: 114 mg/dL — ABNORMAL HIGH (ref 65–99)
Potassium: 3.5 mmol/L (ref 3.5–5.1)
Sodium: 137 mmol/L (ref 135–145)

## 2016-06-30 LAB — CBC
HCT: 46.2 % (ref 39.0–52.0)
HEMOGLOBIN: 16.5 g/dL (ref 13.0–17.0)
MCH: 29.9 pg (ref 26.0–34.0)
MCHC: 35.7 g/dL (ref 30.0–36.0)
MCV: 83.8 fL (ref 78.0–100.0)
Platelets: 221 10*3/uL (ref 150–400)
RBC: 5.51 MIL/uL (ref 4.22–5.81)
RDW: 12.4 % (ref 11.5–15.5)
WBC: 6.2 10*3/uL (ref 4.0–10.5)

## 2016-06-30 LAB — I-STAT TROPONIN, ED: TROPONIN I, POC: 0 ng/mL (ref 0.00–0.08)

## 2016-06-30 LAB — HEPATIC FUNCTION PANEL
ALBUMIN: 4.8 g/dL (ref 3.5–5.0)
ALT: 43 U/L (ref 17–63)
AST: 28 U/L (ref 15–41)
Alkaline Phosphatase: 42 U/L (ref 38–126)
BILIRUBIN TOTAL: 1 mg/dL (ref 0.3–1.2)
Bilirubin, Direct: 0.2 mg/dL (ref 0.1–0.5)
Indirect Bilirubin: 0.8 mg/dL (ref 0.3–0.9)
TOTAL PROTEIN: 7.6 g/dL (ref 6.5–8.1)

## 2016-06-30 LAB — LIPASE, BLOOD: Lipase: 27 U/L (ref 11–51)

## 2016-06-30 MED ORDER — LORAZEPAM 1 MG PO TABS: 1.0000 mg | ORAL_TABLET | Freq: Once | ORAL | Status: DC

## 2016-06-30 MED ORDER — GI COCKTAIL ~~LOC~~
30.0000 mL | Freq: Once | ORAL | Status: AC
  Administered 2016-06-30: 30 mL via ORAL
  Filled 2016-06-30: qty 30

## 2016-06-30 MED ORDER — HYDROXYZINE HCL 25 MG PO TABS: 25.0000 mg | ORAL_TABLET | Freq: Four times a day (QID) | ORAL | 0 refills | Status: DC

## 2016-06-30 MED ORDER — ONDANSETRON HCL 4 MG PO TABS: 4.0000 mg | ORAL_TABLET | Freq: Four times a day (QID) | ORAL | 0 refills | Status: DC

## 2016-06-30 MED ORDER — HYDROXYZINE HCL 25 MG PO TABS
25.0000 mg | ORAL_TABLET | Freq: Once | ORAL | Status: AC
  Administered 2016-06-30: 25 mg via ORAL
  Filled 2016-06-30: qty 1

## 2016-06-30 NOTE — ED Triage Notes (Signed)
Pt c/o of chest pain that he describes as burning sensation and pressure to the mid chest, does not radiate. Associated sx of shortness of breath, elevated BP and lightheadedness.

## 2016-06-30 NOTE — ED Provider Notes (Signed)
[facility]-EMERGENCY DEPT Provider Note   CSN: [id number] Arrival date & time: 06/30/16  1832     History   Chief Complaint Chief Complaint  Patient presents with  . Chest Pain  . Shortness of Breath    HPI Jeremy Peterson is a 30 y.o. male.  The history is provided by the patient. No language interpreter was used.  Chest Pain   Associated symptoms include shortness of breath.  Shortness of Breath  Associated symptoms include chest pain.    Jeremy Peterson is a 30 y.o. male who presents to the Emergency Department complaining of chest pain/sob.  He reports four months of left sided burning chest pain that radiates to his left upper abdomen with intermittent sharp right sided chest pain.  Pain waxes and wanes and at times is associated with SOB.  No fever, cough, vomiting, diarrhea.  He has a hx/o anxiety.  No recent injuries.  He has no hx/o heart disease or blood clots.  No first degree relative with CAD.    Past Medical History:  Diagnosis Date  . DEPRESSION   . Nephrolithiasis   . PAC (premature atrial contraction)     Patient Active Problem List   Diagnosis Date Noted  . Palpitations 05/06/2013  . DEPRESSION 10/27/2010  . FATIGUE 10/27/2010  . DIABETES MELLITUS, BORDERLINE 10/27/2010    Past Surgical History:  Procedure Laterality Date  . APPENDECTOMY    . SURGERY SCROTAL / TESTICULAR         Home Medications    Prior to Admission medications   Medication Sig Start Date End Date Taking? Authorizing Provider  ibuprofen (ADVIL,MOTRIN) 200 MG tablet Take 400 mg by mouth every 6 (six) hours as needed for pain.   Yes Historical Provider, MD  Boris LownKrill Oil 1000 MG CAPS Take 1 capsule by mouth daily.   Yes Historical Provider, MD  hydrOXYzine (ATARAX/VISTARIL) 25 MG tablet Take 1 tablet (25 mg total) by mouth every 6 (six) hours. 06/30/16   Tilden FossaElizabeth Evo Aderman, MD  ondansetron (ZOFRAN) 4 MG tablet Take 1 tablet (4 mg total) by mouth every 6 (six) hours. 06/30/16   Tilden FossaElizabeth Jaskarn Schweer,  MD    Family History Family History  Problem Relation Age of Onset  . Heart disease Paternal Grandfather     Social History Social History  Substance Use Topics  . Smoking status: Former Games developermoker  . Smokeless tobacco: Not on file  . Alcohol use No     Allergies   Review of patient's allergies indicates no known allergies.   Review of Systems Review of Systems  Respiratory: Positive for shortness of breath.   Cardiovascular: Positive for chest pain.  All other systems reviewed and are negative.    Physical Exam Updated Vital Signs BP 131/85   Pulse 96   Temp 98.3 F (36.8 C) (Oral)   Resp 14   SpO2 98%   Physical Exam  Constitutional: He is oriented to person, place, and time. He appears well-developed and well-nourished.  HENT:  Head: Normocephalic and atraumatic.  Cardiovascular: Normal rate and regular rhythm.   No murmur heard. Pulmonary/Chest: Effort normal and breath sounds normal. No respiratory distress.  Abdominal: Soft. There is no tenderness. There is no rebound and no guarding.  Musculoskeletal: He exhibits no edema or tenderness.  Neurological: He is alert and oriented to person, place, and time.  Skin: Skin is warm and dry.  Psychiatric:  anxious  Nursing note and vitals reviewed.    ED Treatments / Results  Labs (all labs ordered are listed, but only abnormal results are displayed) Labs Reviewed  BASIC METABOLIC PANEL - Abnormal; Notable for the following:       Result Value   Glucose, Bld 114 (*)    All other components within normal limits  CBC  HEPATIC FUNCTION PANEL  LIPASE, BLOOD  I-STAT TROPOININ, ED    EKG  EKG Interpretation  Date/Time:  Saturday June 30 2016 18:47:59 EDT Ventricular Rate:  115 PR Interval:    QRS Duration: 94 QT Interval:  317 QTC Calculation: 439 R Axis:   25 Text Interpretation:  Sinus tachycardia with irregular rate Nonspecific T abnormalities, lateral leads Confirmed by Lincoln Brighamees, Liz [phone]([id number]) on  06/30/2016 7:31:07 PM       Radiology Dg Chest 2 View  Result Date: 06/30/2016 CLINICAL DATA:  Chest pain. EXAM: CHEST  2 VIEW COMPARISON:  Radiograph of April 28, 2013. FINDINGS: The heart size and mediastinal contours are within normal limits. Both lungs are clear. No pneumothorax or pleural effusion is noted. The visualized skeletal structures are unremarkable. IMPRESSION: No active cardiopulmonary disease. Electronically Signed   By: Lupita RaiderJames  Green Jr, M.D.   On: 06/30/2016 19:24    Procedures Procedures (including critical care time)  Medications Ordered in ED Medications  gi cocktail (Maalox,Lidocaine,Donnatal) (30 mLs Oral Given 06/30/16 2009)  hydrOXYzine (ATARAX/VISTARIL) tablet 25 mg (25 mg Oral Given 06/30/16 2009)     Initial Impression / Assessment and Plan / ED Course  I have reviewed the triage vital signs and the nursing notes.  Pertinent labs & imaging results that were available during my care of the patient were reviewed by me and considered in my medical decision making (see chart for details).  Clinical Course    Pt w/ hx/o anxiety here for several months of burning chest pain with intermittent epigastric/RUQ pain. He has no significant pain or tenderness on exam.  Presentation is not c/w ACS, PE, dissection, cholecystitis.  He feels improved following atarax, GI cocktail.  Plan to d/c home with outpatient follow up, close return precautions.    Final Clinical Impressions(s) / ED Diagnoses   Final diagnoses:  Atypical chest pain    New Prescriptions Discharge Medication List as of 06/30/2016  8:52 PM    START taking these medications   Details  hydrOXYzine (ATARAX/VISTARIL) 25 MG tablet Take 1 tablet (25 mg total) by mouth every 6 (six) hours., Starting Sat 06/30/2016, Print    ondansetron (ZOFRAN) 4 MG tablet Take 1 tablet (4 mg total) by mouth every 6 (six) hours., Starting Sat 06/30/2016, Print         Tilden FossaElizabeth Ellery Tash, MD 07/01/16 [phone]

## 2016-09-03 HISTORY — PX: CHOLECYSTECTOMY: SHX55

## 2016-11-28 ENCOUNTER — Other Ambulatory Visit (HOSPITAL_BASED_OUTPATIENT_CLINIC_OR_DEPARTMENT_OTHER): Payer: Self-pay | Admitting: Family Medicine

## 2016-11-28 ENCOUNTER — Ambulatory Visit (HOSPITAL_BASED_OUTPATIENT_CLINIC_OR_DEPARTMENT_OTHER)
Admission: RE | Admit: 2016-11-28 | Discharge: 2016-11-28 | Disposition: A | Discharge status: Home / Self Care | Payer: Managed Care, Other (non HMO) | Source: Ambulatory Visit | Attending: Family Medicine | Admitting: Family Medicine

## 2016-11-28 DIAGNOSIS — R1011 Right upper quadrant pain: Secondary | ICD-10-CM

## 2017-07-18 ENCOUNTER — Ambulatory Visit: Payer: Managed Care, Other (non HMO) | Admitting: Family Medicine

## 2017-07-18 ENCOUNTER — Encounter: Payer: Self-pay | Admitting: Family Medicine

## 2017-07-18 VITALS — BP 122/80 | HR 93 | Temp 97.8°F | Ht 70.0 in | Wt 233.2 lb

## 2017-07-18 DIAGNOSIS — Z0000 Encounter for general adult medical examination without abnormal findings: Secondary | ICD-10-CM | POA: Diagnosis not present

## 2017-07-18 DIAGNOSIS — Z23 Encounter for immunization: Secondary | ICD-10-CM | POA: Diagnosis not present

## 2017-07-18 DIAGNOSIS — Z3009 Encounter for other general counseling and advice on contraception: Secondary | ICD-10-CM | POA: Diagnosis not present

## 2017-07-18 DIAGNOSIS — M542 Cervicalgia: Secondary | ICD-10-CM | POA: Diagnosis not present

## 2017-07-18 MED ORDER — CYCLOBENZAPRINE HCL 10 MG PO TABS: 10.0000 mg | ORAL_TABLET | Freq: Three times a day (TID) | ORAL | 0 refills | Status: DC | PRN

## 2017-07-18 NOTE — Progress Notes (Signed)
Pre visit review using our clinic review tool, if applicable. No additional management support is needed unless otherwise documented below in the visit note. 

## 2017-07-18 NOTE — Progress Notes (Signed)
Musculoskeletal Exam  Patient: Jeremy Peterson Elsey DOB: 07/20/1986  DOS: 07/18/2017  SUBJECTIVE:  Chief Complaint:   Chief Complaint  Patient presents with  . Establish Care    Jeremy Peterson Ruzich is a 31 y.o.  male for evaluation and treatment of neck pain.   Onset:  3 months ago. Started when he was lifting something at work.  Location: Mid neck Character:  aching  Progression of issue:  is unchanged Associated symptoms: Some burning over his R shoulder (trap) Treatment: to date has been OTC NSAIDS.   Neurovascular symptoms: no  ROS: Musculoskeletal/Extremities: +neck pain Neurologic: no numbness, tingling no weakness   HIV 07/19/2011  Past Medical History:  Diagnosis Date  . DEPRESSION   . Nephrolithiasis   . PAC (premature atrial contraction)    Past Surgical History:  Procedure Laterality Date  . APPENDECTOMY    . CHOLECYSTECTOMY    . SURGERY SCROTAL / TESTICULAR     Family History  Problem Relation Age of Onset  . Heart disease Paternal Grandfather   . Hyperlipidemia Father   . Hypertension Father   . Cancer Maternal Grandfather    Current Outpatient Medications  Medication Sig Dispense Refill  . ibuprofen (ADVIL,MOTRIN) 200 MG tablet Take 400 mg by mouth every 6 (six) hours as needed for pain.    Boris Lown. Krill Oil 1000 MG CAPS Take 1 capsule by mouth daily.     No Known Allergies Social History   Socioeconomic History  . Marital status: Married   Objective: VITAL SIGNS: BP 122/80 (BP Location: Left Arm, Patient Position: Sitting, Cuff Size: Large)   Pulse 93   Temp 97.8 F (36.6 C)   Ht 5\' 10"  (1.778 m)   Wt 233 lb 4 oz (105.8 kg)   SpO2 99%   BMI 33.47 kg/m  Constitutional: Well formed, well developed. No acute distress. Cardiovascular: Brisk cap refill, RRR Thorax & Lungs: CTAB, No accessory muscle use Extremities: No clubbing. No cyanosis. No edema.  Skin: Warm. Dry. No erythema. No rash.  Musculoskeletal: neck.   Normal active range of motion: yes.    Normal passive range of motion: yes Tenderness to palpation: He has, over paraspinal musculature, more prominent on the right Deformity: no Ecchymosis: no Tests positive:  Tests negative: Neurologic: Normal sensory function. No focal deficits noted. DTR's equal and symmetry in UE's. No clonus. Psychiatric: Normal mood. Age appropriate judgment and insight. Alert & oriented x 3.    Assessment:  Neck pain  Well adult exam - Plan: Lipid panel, Comprehensive metabolic panel, CBC  Vasectomy evaluation - Plan: Ambulatory referral to Urology  Need for diphtheria-tetanus-pertussis (Tdap) vaccine - Plan: Tdap vaccine greater than or equal to 7yo IM  Need for influenza vaccination - Plan: Flu Vaccine QUAD 6+ mos PF IM (Fluarix Quad PF)  Plan: Orders as above.  Home stretches/exercises, muscle relaxant, heat, Tylenol, ibuprofen. Low concern for neurologic etiology. Refer to urology for vasectomy evaluation.  Future labs or physical ordered.  Follow-up after labs are drawn. The patient voiced understanding and agreement to the plan.   Jilda Rocheicholas Paul IretonWendling, DO 07/18/17  2:37 PM

## 2017-07-18 NOTE — Patient Instructions (Addendum)
Heat (pad or rice pillow in microwave) over affected area, 10-15 minutes every 2-3 hours while awake.   Ibuprofen 400-600 mg (2-3 over the counter strength tabs) every 6 hours as needed for pain.  OK to take Tylenol 1000 mg (2 extra strength tabs) or 975 mg (3 regular strength tabs) every 6 hours as needed.  Take Flexeril (cyclobenzaprine) 1-2 hours before planned bedtime. If it makes you drowsy, do not take during the day or if you need your wits about you. You can try half a tab the following night.  Fasting for 9-12 hours prior to your labs is desirable. Black coffee OK.  EXERCISES RANGE OF MOTION (ROM) AND STRETCHING EXERCISES  These exercises may help you when beginning to rehabilitate your issue. In order to successfully resolve your symptoms, you must improve your posture. These exercises are designed to help reduce the forward-head and rounded-shoulder posture which contributes to this condition. Your symptoms may resolve with or without further involvement from your physician, physical therapist or athletic trainer. While completing these exercises, remember:   Restoring tissue flexibility helps normal motion to return to the joints. This allows healthier, less painful movement and activity.  An effective stretch should be held for at least 20 seconds, although you may need to begin with shorter hold times for comfort.  A stretch should never be painful. You should only feel a gentle lengthening or release in the stretched tissue.  Do not do any stretch or exercise that you cannot tolerate.  STRETCH- Axial Extensors  Lie on your back on the floor. You may bend your knees for comfort. Place a rolled-up hand towel or dish towel, about 2 inches in diameter, under the part of your head that makes contact with the floor.  Gently tuck your chin, as if trying to make a "double chin," until you feel a gentle stretch at the base of your head.  Hold 15-20 seconds. Repeat 2-3 times.  Complete this exercise 1 time per day.   STRETCH - Axial Extension   Stand or sit on a firm surface. Assume a good posture: chest up, shoulders drawn back, abdominal muscles slightly tense, knees unlocked (if standing) and feet hip width apart.  Slowly retract your chin so your head slides back and your chin slightly lowers. Continue to look straight ahead.  You should feel a gentle stretch in the back of your head. Be certain not to feel an aggressive stretch since this can cause headaches later.  Hold for 15-20 seconds. Repeat 2-3 times. Complete this exercise 1 time per day.  STRETCH - Cervical Side Bend   Stand or sit on a firm surface. Assume a good posture: chest up, shoulders drawn back, abdominal muscles slightly tense, knees unlocked (if standing) and feet hip width apart.  Without letting your nose or shoulders move, slowly tip your right / left ear to your shoulder until your feel a gentle stretch in the muscles on the opposite side of your neck.  Hold 15-20 seconds. Repeat 2-3 times. Complete this exercise 1-2 times per day.  STRETCH - Cervical Rotators   Stand or sit on a firm surface. Assume a good posture: chest up, shoulders drawn back, abdominal muscles slightly tense, knees unlocked (if standing) and feet hip width apart.  Keeping your eyes level with the ground, slowly turn your head until you feel a gentle stretch along the back and opposite side of your neck.  Hold 15-20 seconds. Repeat 2-3 times. Complete this exercise 1-2  times per day.  RANGE OF MOTION - Neck Circles   Stand or sit on a firm surface. Assume a good posture: chest up, shoulders drawn back, abdominal muscles slightly tense, knees unlocked (if standing) and feet hip width apart.  Gently roll your head down and around from the back of one shoulder to the back of the other. The motion should never be forced or painful.  Repeat the motion 10-20 times, or until you feel the neck muscles relax and  loosen. Repeat 2-3 times. Complete the exercise 1-2 times per day. STRENGTHENING EXERCISES - Cervical Strain and Sprain These exercises may help you when beginning to rehabilitate your injury. They may resolve your symptoms with or without further involvement from your physician, physical therapist, or athletic trainer. While completing these exercises, remember:   Muscles can gain both the endurance and the strength needed for everyday activities through controlled exercises.  Complete these exercises as instructed by your physician, physical therapist, or athletic trainer. Progress the resistance and repetitions only as guided.  You may experience muscle soreness or fatigue, but the pain or discomfort you are trying to eliminate should never worsen during these exercises. If this pain does worsen, stop and make certain you are following the directions exactly. If the pain is still present after adjustments, discontinue the exercise until you can discuss the trouble with your clinician.  STRENGTH - Cervical Flexors, Isometric  Face a wall, standing about 6 inches away. Place a small pillow, a ball about 6-8 inches in diameter, or a folded towel between your forehead and the wall.  Slightly tuck your chin and gently push your forehead into the soft object. Push only with mild to moderate intensity, building up tension gradually. Keep your jaw and forehead relaxed.  Hold 10 to 20 seconds. Keep your breathing relaxed.  Release the tension slowly. Relax your neck muscles completely before you start the next repetition. Repeat 2-3 times. Complete this exercise 1 time per day.  STRENGTH- Cervical Lateral Flexors, Isometric   Stand about 6 inches away from a wall. Place a small pillow, a ball about 6-8 inches in diameter, or a folded towel between the side of your head and the wall.  Slightly tuck your chin and gently tilt your head into the soft object. Push only with mild to moderate intensity,  building up tension gradually. Keep your jaw and forehead relaxed.  Hold 10 to 20 seconds. Keep your breathing relaxed.  Release the tension slowly. Relax your neck muscles completely before you start the next repetition. Repeat 2-3 times. Complete this exercise 1 time per day.  STRENGTH - Cervical Extensors, Isometric   Stand about 6 inches away from a wall. Place a small pillow, a ball about 6-8 inches in diameter, or a folded towel between the back of your head and the wall.  Slightly tuck your chin and gently tilt your head back into the soft object. Push only with mild to moderate intensity, building up tension gradually. Keep your jaw and forehead relaxed.  Hold 10 to 20 seconds. Keep your breathing relaxed.  Release the tension slowly. Relax your neck muscles completely before you start the next repetition. Repeat 2-3 times. Complete this exercise 1 time per day.  POSTURE AND BODY MECHANICS CONSIDERATIONS Keeping correct posture when sitting, standing or completing your activities will reduce the stress put on different body tissues, allowing injured tissues a chance to heal and limiting painful experiences. The following are general guidelines for improved posture. Your  physician or physical therapist will provide you with any instructions specific to your needs. While reading these guidelines, remember:  The exercises prescribed by your provider will help you have the flexibility and strength to maintain correct postures.  The correct posture provides the optimal environment for your joints to work. All of your joints have less wear and tear when properly supported by a spine with good posture. This means you will experience a healthier, less painful body.  Correct posture must be practiced with all of your activities, especially prolonged sitting and standing. Correct posture is as important when doing repetitive low-stress activities (typing) as it is when doing a single  heavy-load activity (lifting).  PROLONGED STANDING WHILE SLIGHTLY LEANING FORWARD When completing a task that requires you to lean forward while standing in one place for a long time, place either foot up on a stationary 2- to 4-inch high object to help maintain the best posture. When both feet are on the ground, the low back tends to lose its slight inward curve. If this curve flattens (or becomes too large), then the back and your other joints will experience too much stress, fatigue more quickly, and can cause pain.   RESTING POSITIONS Consider which positions are most painful for you when choosing a resting position. If you have pain with flexion-based activities (sitting, bending, stooping, squatting), choose a position that allows you to rest in a less flexed posture. You would want to avoid curling into a fetal position on your side. If your pain worsens with extension-based activities (prolonged standing, working overhead), avoid resting in an extended position such as sleeping on your stomach. Most people will find more comfort when they rest with their spine in a more neutral position, neither too rounded nor too arched. Lying on a non-sagging bed on your side with a pillow between your knees, or on your back with a pillow under your knees will often provide some relief. Keep in mind, being in any one position for a prolonged period of time, no matter how correct your posture, can still lead to stiffness.  WALKING Walk with an upright posture. Your ears, shoulders, and hips should all line up. OFFICE WORK When working at a desk, create an environment that supports good, upright posture. Without extra support, muscles fatigue and lead to excessive strain on joints and other tissues.  CHAIR:  A chair should be able to slide under your desk when your back makes contact with the back of the chair. This allows you to work closely.  The chair's height should allow your eyes to be level with the  upper part of your monitor and your hands to be slightly lower than your elbows.  Body position: ? Your feet should make contact with the floor. If this is not possible, use a foot rest. ? Keep your ears over your shoulders. This will reduce stress on your neck and low back.  If you do not hear anything about your referral in the next 1-2 weeks, call our office and ask for an update.

## 2017-08-12 ENCOUNTER — Other Ambulatory Visit: Payer: Managed Care, Other (non HMO)

## 2017-08-15 ENCOUNTER — Ambulatory Visit: Payer: Managed Care, Other (non HMO) | Admitting: Family Medicine

## 2017-09-23 ENCOUNTER — Ambulatory Visit (INDEPENDENT_AMBULATORY_CARE_PROVIDER_SITE_OTHER): Payer: 59 | Admitting: Internal Medicine

## 2017-09-23 ENCOUNTER — Encounter: Payer: Self-pay | Admitting: Internal Medicine

## 2017-09-23 VITALS — BP 126/70 | HR 95 | Temp 98.2°F | Resp 14 | Ht 70.0 in | Wt 234.0 lb

## 2017-09-23 DIAGNOSIS — B349 Viral infection, unspecified: Secondary | ICD-10-CM

## 2017-09-23 DIAGNOSIS — R05 Cough: Secondary | ICD-10-CM | POA: Diagnosis not present

## 2017-09-23 DIAGNOSIS — R059 Cough, unspecified: Secondary | ICD-10-CM

## 2017-09-23 LAB — POCT INFLUENZA A/B
INFLUENZA A, POC: NEGATIVE
INFLUENZA B, POC: NEGATIVE

## 2017-09-23 LAB — POCT RAPID STREP A (OFFICE): Rapid Strep A Screen: NEGATIVE

## 2017-09-23 NOTE — Progress Notes (Signed)
Subjective:    Patient ID: Jeremy FarrierBryan P Winkel, male    DOB: 12/27/1985, 32 y.o.   MRN: [id number]  DOS:  09/23/2017 Type of visit - description : Acute visit Interval history: Symptoms started 2 days ago: Dry cough,.  Taking NyQuil and ibuprofen frequently. He is here today because starting last night he started to notice sputum, with cough, color?. He has a 762-month-old baby who is just recovering from respiratory infection, had an antibiotics. 8052-month-old baby is healthy.   Review of Systems Mild sore throat No sinus pain but some congestion No chest pain Nausea but no vomiting. Mild body aches but reports he is taking ibuprofen frequently. No headache or a rash.  Past Medical History:  Diagnosis Date  . DEPRESSION   . Nephrolithiasis   . PAC (premature atrial contraction)     Past Surgical History:  Procedure Laterality Date  . APPENDECTOMY    . CHOLECYSTECTOMY    . SURGERY SCROTAL / TESTICULAR      Social History   Socioeconomic History  . Marital status: Married    Spouse name: Not on file  . Number of children: 1  . Years of education: Not on file  . Highest education level: Not on file  Social Needs  . Financial resource strain: Not on file  . Food insecurity - worry: Not on file  . Food insecurity - inability: Not on file  . Transportation needs - medical: Not on file  . Transportation needs - non-medical: Not on file  Occupational History  . Not on file  Tobacco Use  . Smoking status: Former Games developermoker  . Smokeless tobacco: Never Used  Substance and Sexual Activity  . Alcohol use: No  . Drug use: No    Comment: Previous substance abuse  . Sexual activity: Not on file  Other Topics Concern  . Not on file  Social History Narrative  . Not on file      Allergies as of 09/23/2017   No Known Allergies     Medication List        Accurate as of 09/23/17 11:59 PM. Always use your most recent med list.          cyclobenzaprine 10 MG  tablet Commonly known as:  FLEXERIL Take 1 tablet (10 mg total) 3 (three) times daily as needed by mouth for muscle spasms.   ibuprofen 200 MG tablet Commonly known as:  ADVIL,MOTRIN Take 400 mg by mouth every 6 (six) hours as needed for pain.   Krill Oil 1000 MG Caps Take 1 capsule by mouth daily.          Objective:   Physical Exam BP 126/70 (BP Location: Left Arm, Patient Position: Sitting, Cuff Size: Normal)   Pulse 95   Temp 98.2 F (36.8 C) (Oral)   Resp 14   Ht 5\' 10"  (1.778 m)   Wt 234 lb (106.1 kg)   SpO2 96%   BMI 33.58 kg/m  General:   Well developed, well nourished . NAD.  HEENT:  Normocephalic . Face symmetric, atraumatic. TMs normal, nose congested, sinuses non-TTP.  Throat is slightly red but no white patches.  Symmetric. Lungs:  CTA B Normal respiratory effort, no intercostal retractions, no accessory muscle use. Heart: RRR,  no murmur.  No pretibial edema bilaterally  Skin: Not pale. Not jaundice Neurologic:  alert & oriented X3.  Speech normal, gait appropriate for age and unassisted Psych--  Cognition and judgment appear intact.  Cooperative with normal  attention span and concentration.  Behavior appropriate. No anxious or depressed appearing.      Assessment & Plan:   32 year old male presents with: Viral syndrome: Flu test and rapid strep test negative.  Recommend conservative treatment.  Use a mask.  See instructions

## 2017-09-23 NOTE — Progress Notes (Signed)
Pre visit review using our clinic review tool, if applicable. No additional management support is needed unless otherwise documented below in the visit note. 

## 2017-09-23 NOTE — Patient Instructions (Signed)
Rest, fluids , tylenol, use a mask at home  For cough:  Take Mucinex DM twice a day as needed until better  For nasal congestion: Use OTC Nasocort or Flonase : 2 nasal sprays on each side of the nose in the morning until you feel better     Get pseudoephedrine 30 mg (behind the counter, you need to talk with the pharmacist) take one tablet 3 or 4 times a day as needed for congestion    Call if not gradually better over the next  10 days  Call anytime if the symptoms are severe

## 2017-12-01 ENCOUNTER — Other Ambulatory Visit: Payer: Self-pay

## 2017-12-01 ENCOUNTER — Encounter (HOSPITAL_BASED_OUTPATIENT_CLINIC_OR_DEPARTMENT_OTHER): Payer: Self-pay | Admitting: *Deleted

## 2017-12-01 ENCOUNTER — Emergency Department (HOSPITAL_BASED_OUTPATIENT_CLINIC_OR_DEPARTMENT_OTHER)
Admission: EM | Admit: 2017-12-01 | Discharge: 2017-12-01 | Disposition: A | Discharge status: Home / Self Care | Payer: 59 | Attending: Emergency Medicine | Admitting: Emergency Medicine

## 2017-12-01 DIAGNOSIS — F419 Anxiety disorder, unspecified: Secondary | ICD-10-CM

## 2017-12-01 DIAGNOSIS — R42 Dizziness and giddiness: Secondary | ICD-10-CM | POA: Diagnosis not present

## 2017-12-01 DIAGNOSIS — R002 Palpitations: Secondary | ICD-10-CM | POA: Diagnosis not present

## 2017-12-01 DIAGNOSIS — Z79899 Other long term (current) drug therapy: Secondary | ICD-10-CM | POA: Insufficient documentation

## 2017-12-01 DIAGNOSIS — F418 Other specified anxiety disorders: Secondary | ICD-10-CM | POA: Insufficient documentation

## 2017-12-01 HISTORY — DX: Anxiety disorder, unspecified: F41.9

## 2017-12-01 LAB — COMPREHENSIVE METABOLIC PANEL
ALBUMIN: 4.3 g/dL (ref 3.5–5.0)
ALK PHOS: 46 U/L (ref 38–126)
ALT: 41 U/L (ref 17–63)
ANION GAP: 10 (ref 5–15)
AST: 28 U/L (ref 15–41)
BILIRUBIN TOTAL: 0.5 mg/dL (ref 0.3–1.2)
BUN: 13 mg/dL (ref 6–20)
CALCIUM: 8.9 mg/dL (ref 8.9–10.3)
CO2: 21 mmol/L — ABNORMAL LOW (ref 22–32)
Chloride: 105 mmol/L (ref 101–111)
Creatinine, Ser: 0.85 mg/dL (ref 0.61–1.24)
GFR calc Af Amer: >60 mL/min (ref >60)
GFR calc non Af Amer: >60 mL/min (ref >60)
GLUCOSE: 124 mg/dL — AB (ref 65–99)
Potassium: 3.7 mmol/L (ref 3.5–5.1)
Sodium: 136 mmol/L (ref 135–145)
TOTAL PROTEIN: 7.2 g/dL (ref 6.5–8.1)

## 2017-12-01 LAB — CBC WITH DIFFERENTIAL/PLATELET
Basophils Absolute: 0 10*3/uL (ref 0.0–0.1)
Basophils Relative: 0 %
Eosinophils Absolute: 0.2 10*3/uL (ref 0.0–0.7)
Eosinophils Relative: 2 %
HEMATOCRIT: 42.9 % (ref 39.0–52.0)
HEMOGLOBIN: 15.4 g/dL (ref 13.0–17.0)
LYMPHS ABS: 2.5 10*3/uL (ref 0.7–4.0)
Lymphocytes Relative: 33 %
MCH: 29.9 pg (ref 26.0–34.0)
MCHC: 35.9 g/dL (ref 30.0–36.0)
MCV: 83.3 fL (ref 78.0–100.0)
MONOS PCT: 8 %
Monocytes Absolute: 0.6 10*3/uL (ref 0.1–1.0)
NEUTROS ABS: 4.3 10*3/uL (ref 1.7–7.7)
NEUTROS PCT: 57 %
Platelets: 226 10*3/uL (ref 150–400)
RBC: 5.15 MIL/uL (ref 4.22–5.81)
RDW: 12.7 % (ref 11.5–15.5)
WBC: 7.6 10*3/uL (ref 4.0–10.5)

## 2017-12-01 LAB — MAGNESIUM: Magnesium: 2.1 mg/dL (ref 1.7–2.4)

## 2017-12-01 LAB — LIPASE, BLOOD: Lipase: 34 U/L (ref 11–51)

## 2017-12-01 MED ORDER — LORAZEPAM 0.5 MG PO TABS: 0.5000 mg | ORAL_TABLET | Freq: Three times a day (TID) | ORAL | 0 refills | Status: DC | PRN

## 2017-12-01 NOTE — ED Notes (Signed)
ED Provider at bedside. 

## 2017-12-01 NOTE — ED Notes (Addendum)
Patient stated that he had this incident before, long time ago.  He was taking Klonopin then.  He think this is work-stress related.  Denies dizziness or palpitation at this time.

## 2017-12-01 NOTE — Discharge Instructions (Signed)
Your workup today was overall reassuring.  We suspect you had a panic attack causing the palpitations, lightheadedness, and anxiety that you had earlier today.  Your elect lites were all reassuring and your blood work looked good.  Please follow-up with the primary doctor to discuss further ongoing management and monitoring of your episodes.  If you develop any new or worsened symptoms, please return immediately to the nearest emergency department.

## 2017-12-01 NOTE — ED Provider Notes (Signed)
[facility] EMERGENCY DEPARTMENT Provider Note   CSN: [id number] Arrival date & time: 12/01/17  0551     History   Chief Complaint Chief Complaint  Patient presents with  . dizziness    HPI Jeremy Peterson is a 32 y.o. male.  The history is provided by the patient, the spouse and medical records. No language interpreter was used.  Palpitations   This is a new problem. The current episode started 3 to 5 hours ago. The problem occurs rarely. The problem has been resolved. The problem is associated with anxiety. On average, each episode lasts 2 hours. Associated symptoms include nausea and shortness of breath. Pertinent negatives include no diaphoresis, no fever, no malaise/fatigue, no numbness, no chest pain, no chest pressure, no exertional chest pressure, no irregular heartbeat, no near-syncope, no syncope, no abdominal pain, no vomiting, no headaches, no back pain, no lower extremity edema, no dizziness and no weakness. He has tried deep relaxation for the symptoms. The treatment provided mild relief. Risk factors include stress.    Past Medical History:  Diagnosis Date  . Anxiety   . DEPRESSION   . Nephrolithiasis   . PAC (premature atrial contraction)     Patient Active Problem List   Diagnosis Date Noted  . Palpitations 05/06/2013  . DEPRESSION 10/27/2010  . FATIGUE 10/27/2010  . DIABETES MELLITUS, BORDERLINE 10/27/2010    Past Surgical History:  Procedure Laterality Date  . APPENDECTOMY    . CHOLECYSTECTOMY    . SURGERY SCROTAL / TESTICULAR          Home Medications    Prior to Admission medications   Medication Sig Start Date End Date Taking? Authorizing Provider  cyclobenzaprine (FLEXERIL) 10 MG tablet Take 1 tablet (10 mg total) 3 (three) times daily as needed by mouth for muscle spasms. Patient not taking: Reported on 09/23/2017 07/18/17   Sharlene Dory, DO  ibuprofen (ADVIL,MOTRIN) 200 MG tablet Take 400 mg by mouth every 6 (six)  hours as needed for pain.    [provider]  Boris Lown Oil 1000 MG CAPS Take 1 capsule by mouth daily.    [provider]    Family History Family History  Problem Relation Age of Onset  . Heart disease Paternal Grandfather   . Hyperlipidemia Father   . Hypertension Father   . Cancer Maternal Grandfather     Social History Social History   Tobacco Use  . Smoking status: Former Games developer  . Smokeless tobacco: Never Used  Substance Use Topics  . Alcohol use: No  . Drug use: No    Comment: Previous substance abuse     Allergies   Patient has no known allergies.   Review of Systems Review of Systems  Constitutional: Negative for chills, diaphoresis, fatigue, fever and malaise/fatigue.  HENT: Negative for congestion.   Respiratory: Positive for shortness of breath. Negative for chest tightness, wheezing and stridor.   Cardiovascular: Positive for palpitations. Negative for chest pain, syncope and near-syncope.  Gastrointestinal: Positive for nausea. Negative for abdominal pain, constipation, diarrhea and vomiting.  Genitourinary: Negative for dysuria, flank pain and frequency.  Musculoskeletal: Negative for back pain, neck pain and neck stiffness.  Neurological: Positive for light-headedness (resolved). Negative for dizziness, tremors, weakness, numbness and headaches.  Psychiatric/Behavioral: Negative for agitation.  All other systems reviewed and are negative.    Physical Exam Updated Vital Signs BP 128/83   Pulse 83   Resp 15   SpO2 99%   Physical Exam  Constitutional: He is oriented to person, place, and time. He appears well-developed and well-nourished. No distress.  HENT:  Head: Normocephalic.  Nose: Nose normal.  Mouth/Throat: Oropharynx is clear and moist. No oropharyngeal exudate.  Eyes: Pupils are equal, round, and reactive to light. Conjunctivae and EOM are normal.  Neck: Normal range of motion.  Cardiovascular: Normal rate, regular  rhythm, normal heart sounds and intact distal pulses.  No murmur heard. Pulmonary/Chest: Effort normal. No respiratory distress. He has no wheezes. He exhibits no tenderness.  Abdominal: Soft. Bowel sounds are normal. He exhibits no distension. There is no tenderness.  Musculoskeletal: He exhibits no edema.  Neurological: He is alert and oriented to person, place, and time. He displays no tremor and normal reflexes. No cranial nerve deficit or sensory deficit. He exhibits normal muscle tone. Coordination normal. GCS eye subscore is 4. GCS verbal subscore is 5. GCS motor subscore is 6.  Skin: Capillary refill takes less than 2 seconds. He is not diaphoretic. No erythema. No pallor.  Psychiatric: He has a normal mood and affect.  Nursing note and vitals reviewed.    ED Treatments / Results  Labs (all labs ordered are listed, but only abnormal results are displayed) Labs Reviewed  COMPREHENSIVE METABOLIC PANEL - Abnormal; Notable for the following components:      Result Value   CO2 21 (*)    Glucose, Bld 124 (*)    All other components within normal limits  CBC WITH DIFFERENTIAL/PLATELET  LIPASE, BLOOD  MAGNESIUM    EKG EKG Interpretation  Date/Time:  Sunday December 01 2017 05:59:31 EDT Ventricular Rate:  110 PR Interval:  138 QRS Duration: 94 QT Interval:  326 QTC Calculation: 441 R Axis:   27 Text Interpretation:  Sinus tachycardia Otherwise normal ECG No significant change since last tracing Confirmed by Zadie RhineWickline, Donald ([id number]) on 12/01/2017 6:07:53 AM   Radiology No results found.  Procedures Procedures (including critical care time)  Medications Ordered in ED Medications - No data to display   Initial Impression / Assessment and Plan / ED Course  I have reviewed the triage vital signs and the nursing notes.  Pertinent labs & imaging results that were available during my care of the patient were reviewed by me and considered in my medical decision making (see chart  for details).     Jeremy Peterson is a 32 y.o. male with a past medical history significant for anxiety, depression, and kidney stones who presents with an episode of palpitations, lightheadedness, nausea, and anxiety overnight.  Patient reports that he woke up this morning feeling very anxious with palpitations and nausea.  He reports feeling lightheaded and had to sit down.  He denies any syncope.  He denies any chest pain.  He denies any emesis or any neurologic deficits.  He denies any other complaints.  [place] had increase in stress due to it being a busy week for the [place] market.  He denies recent medication changes or drug use.  He denies other complaints on arrival.    On exam, patient is well-appearing.  He reports his symptoms have resolved prior to arrival to the ED.  He denies any numbness, tingling, or weakness.  His lungs were clear.  Chest is nontender.  Symmetric pulses in all extremities.  No focal neurologic deficits on my initial exam.  Abdomen nontender.  Nonedematous.  Suspect panic attack which is what the patient thinks it was his anxiety.  He will have laboratory testing to look for electrolyte abnormalities and EKG to look for gross arrhythmia.    EKG reassuring and appears similar to prior.  Labs were overall reassuring.    Patient was feeling better after observation in the emergency department.  No other episodes of lightheadedness, anxiety, or arrhythmia seen.  Cannot definitively rule out an arrhythmia that was transient, patient instructed to follow-up with PCP for likely Holter monitor evaluation.  Patient given prescription for Ativan to help with panic attacks for the next few days and will follow up with his PCP next week for further management.  Patient was understanding the plan of care as well as return precautions.  Patient had no other questions or concerns and was discharged in good condition.   Final Clinical  Impressions(s) / ED Diagnoses   Final diagnoses:  Palpitations  Lightheadedness  Anxiety    ED Discharge Orders        Ordered    LORazepam (ATIVAN) 0.5 MG tablet  3 times daily PRN     12/01/17 1159      Clinical Impression: 1. Palpitations   2. Lightheadedness   3. Anxiety     Disposition: Discharge  Condition: Good  I have discussed the results, Dx and Tx plan with the pt(& family if present). He/she/they expressed understanding and agree(s) with the plan. Discharge instructions discussed at great length. Strict return precautions discussed and pt &/or family have verbalized understanding of the instructions. No further questions at time of discharge.    New Prescriptions   LORAZEPAM (ATIVAN) 0.5 MG TABLET    Take 1 tablet (0.5 mg total) by mouth 3 (three) times daily as needed for anxiety.    Follow Up: Sharlene Dory, DO [address] [place] [address] [phone]     [facility] [place] [facility] [address] [id number] GUYQ [place] [address] [phone]       Malayla Granberry, Canary Brim, MD 12/01/17 [phone]

## 2017-12-01 NOTE — ED Triage Notes (Addendum)
Pt states that he woke up from his sleep feeling like his heart was racing. C/o feeling dizzy and general weakness. C/o feeling sob. Denies any n/v. States his vision does not feel focused. No obvious neuro deficits. States hx of anxiety and panic attacks  and states he feeling like he cannot take a deep breath. Sinus 110 on tele

## 2017-12-01 NOTE — ED Notes (Signed)
Alert, NAD, calm and tense, mildly anxious, interactive, resps e/u, speaking in clear complete sentences, no dyspnea noted, skin W&D, VSS, ST HR 94 on monitor w/o ectopy.  Reports: Woke with heart racing. Sx onset 0500, "woke me up". Felt fine when I went to bed. "feel better now than before but still feeling off, dizzy or light headed, weak, vision is off, nauseated and unsettled".  Feels different than past anxiety or panic attacks. Last panic attack was years ago. Does not take meds for anxiety/panic, "haven't needed them". No meds PTA. (denies: pain, sob, VD, numbness or tingling, or fever).

## 2018-06-12 ENCOUNTER — Encounter: Payer: Self-pay | Admitting: Family Medicine

## 2018-06-12 ENCOUNTER — Ambulatory Visit (INDEPENDENT_AMBULATORY_CARE_PROVIDER_SITE_OTHER): Payer: 59 | Admitting: Family Medicine

## 2018-06-12 VITALS — BP 118/72 | HR 98 | Temp 98.6°F | Ht 70.0 in | Wt 234.0 lb

## 2018-06-12 DIAGNOSIS — B9689 Other specified bacterial agents as the cause of diseases classified elsewhere: Secondary | ICD-10-CM

## 2018-06-12 DIAGNOSIS — J208 Acute bronchitis due to other specified organisms: Secondary | ICD-10-CM | POA: Diagnosis not present

## 2018-06-12 MED ORDER — METHYLPREDNISOLONE ACETATE 80 MG/ML IJ SUSP
80.0000 mg | Freq: Once | INTRAMUSCULAR | Status: AC
  Administered 2018-06-12: 80 mg via INTRAMUSCULAR

## 2018-06-12 MED ORDER — AZITHROMYCIN 250 MG PO TABS
ORAL_TABLET | ORAL | 0 refills | Status: DC
Start: 2018-06-12 — End: 2018-07-21

## 2018-06-12 NOTE — Patient Instructions (Addendum)
Continue to push fluids, practice good hand hygiene, and cover your mouth if you cough.  If you start having fevers, shaking or shortness of breath, seek immediate care.  Stop your over the counter medication. If your fever returns, take the antibiotic.  For symptoms, consider using Vick's VapoRub on chest or under nose, air humidifier, Benadryl at night, and elevating the head of the bed. Tylenol and ibuprofen for aches and pains you may be experiencing after tonight.  Let us know if you need anything.

## 2018-06-12 NOTE — Progress Notes (Signed)
Pre visit review using our clinic review tool, if applicable. No additional management support is needed unless otherwise documented below in the visit note. 

## 2018-06-12 NOTE — Addendum Note (Signed)
Addended by: Scharlene Gloss B on: 06/12/2018 03:57 PM   Modules accepted: Orders

## 2018-06-12 NOTE — Progress Notes (Signed)
Chief Complaint  Patient presents with  . Fever  . Cough  . Sore Throat    Jeremy Peterson here for URI complaints.  Duration: 5 days  Associated symptoms: Fever (101 F), sinus congestion, sinus pain, rhinorrhea, ear pain, sore throat, wheezing, shortness of breath and cough Denies: ear drainage and myalgia Treatment to date: Cough and cold meds, ibuprofen Sick contacts: Yes  ROS:  Const: +fevers HEENT: As noted in HPI Lungs: +SOB  Past Medical History:  Diagnosis Date  . Anxiety   . DEPRESSION   . Nephrolithiasis   . PAC (premature atrial contraction)     BP 118/72 (BP Location: Left Arm, Patient Position: Sitting, Cuff Size: Large)   Pulse 98   Temp 98.6 F (37 C) (Oral)   Ht 5\' 10"  (1.778 m)   Wt 234 lb (106.1 kg)   SpO2 96%   BMI 33.58 kg/m  General: Awake, alert, appears stated age HEENT: AT, [place], ears patent b/l and TM's neg, nares patent w/o discharge, pharynx erythematous but without exudates, MMM Neck: No masses or asymmetry Heart: RRR Lungs: CTAB, no accessory muscle use Psych: Age appropriate judgment and insight, normal mood and affect  Acute bacterial bronchitis - Plan: azithromycin (ZITHROMAX) 250 MG tablet  Orders as above. Stop home meds, start zpak if pain returns. Steroid shot for symptoms.  Continue to push fluids, practice good hand hygiene, cover mouth when coughing. F/u prn. If starting to experience fevers, shaking, or shortness of breath, seek immediate care. Pt voiced understanding and agreement to the plan.  Jilda Roche [place], DO 06/12/18 3:42 PM

## 2018-07-21 ENCOUNTER — Ambulatory Visit (INDEPENDENT_AMBULATORY_CARE_PROVIDER_SITE_OTHER): Payer: 59 | Admitting: Family Medicine

## 2018-07-21 ENCOUNTER — Encounter: Payer: Self-pay | Admitting: Family Medicine

## 2018-07-21 ENCOUNTER — Telehealth: Payer: Self-pay | Admitting: Family Medicine

## 2018-07-21 VITALS — BP 118/78 | HR 83 | Temp 98.3°F | Ht 70.0 in | Wt 232.0 lb

## 2018-07-21 DIAGNOSIS — R197 Diarrhea, unspecified: Secondary | ICD-10-CM | POA: Diagnosis not present

## 2018-07-21 DIAGNOSIS — R112 Nausea with vomiting, unspecified: Secondary | ICD-10-CM | POA: Diagnosis not present

## 2018-07-21 DIAGNOSIS — R140 Abdominal distension (gaseous): Secondary | ICD-10-CM

## 2018-07-21 LAB — COMPREHENSIVE METABOLIC PANEL
ALBUMIN: 4.5 g/dL (ref 3.5–5.2)
ALK PHOS: 52 U/L (ref 39–117)
ALT: 38 U/L (ref 0–53)
AST: 19 U/L (ref 0–37)
BILIRUBIN TOTAL: 0.5 mg/dL (ref 0.2–1.2)
BUN: 9 mg/dL (ref 6–23)
CO2: 25 meq/L (ref 19–32)
CREATININE: 0.81 mg/dL (ref 0.40–1.50)
Calcium: 9.1 mg/dL (ref 8.4–10.5)
Chloride: 104 mEq/L (ref 96–112)
GFR: 116.4 mL/min (ref >60.00)
GLUCOSE: 94 mg/dL (ref 70–99)
Potassium: 4 mEq/L (ref 3.5–5.1)
SODIUM: 136 meq/L (ref 135–145)
TOTAL PROTEIN: 6.8 g/dL (ref 6.0–8.3)

## 2018-07-21 LAB — CBC
HEMATOCRIT: 45.6 % (ref 39.0–52.0)
Hemoglobin: 15.8 g/dL (ref 13.0–17.0)
MCHC: 34.6 g/dL (ref 30.0–36.0)
MCV: 85.8 fl (ref 78.0–100.0)
Platelets: 217 10*3/uL (ref 150.0–400.0)
RBC: 5.32 Mil/uL (ref 4.22–5.81)
RDW: 13.1 % (ref 11.5–15.5)
WBC: 5.9 10*3/uL (ref 4.0–10.5)

## 2018-07-21 MED ORDER — DICYCLOMINE HCL 10 MG PO CAPS: 10.0000 mg | ORAL_CAPSULE | Freq: Three times a day (TID) | ORAL | 0 refills | Status: DC

## 2018-07-21 MED ORDER — ONDANSETRON HCL 4 MG PO TABS: 4.0000 mg | ORAL_TABLET | Freq: Three times a day (TID) | ORAL | 0 refills | Status: DC | PRN

## 2018-07-21 NOTE — Telephone Encounter (Signed)
1130 if he can make it.

## 2018-07-21 NOTE — Progress Notes (Signed)
Pre visit review using our clinic review tool, if applicable. No additional management support is needed unless otherwise documented below in the visit note. 

## 2018-07-21 NOTE — Progress Notes (Signed)
Chief Complaint  Patient presents with  . Abdominal Pain    Jeremy Peterson is here for R sided abd pressure.  Duration: 1 week  Associated symptoms: nausea, 1 time episode of vomiting and diarrhea Denies: inability to keep down fluids and fevers  No sick contacts. Treatment to date: Pepto Bismol, Immodium AD  ROS: Constitutional: No fevers GI: As noted in HPI  Past Medical History:  Diagnosis Date  . Anxiety   . DEPRESSION   . Nephrolithiasis   . PAC (premature atrial contraction)    BP 118/78 (BP Location: Left Arm, Patient Position: Sitting, Cuff Size: Large)   Pulse 83   Temp 98.3 F (36.8 C) (Oral)   Ht 5\' 10"  (1.778 m)   Wt 232 lb (105.2 kg)   SpO2 97%   BMI 33.29 kg/m  Gen.: Awake, alert, appears stated age HEENT: Mucous membranes moist without mucosal lesions Heart: Regular rate and rhythm without murmurs Lungs: Clear auscultation bilaterally, no rales or wheezing, normal effort without accessory muscle use. Abdomen: Bowel sounds are present. Abdomen is soft, non-tender- thought did have discomfort on R side of abd, mild distension, no masses or organomegaly. Negative Murphy's, Rovsing's, McBurney's, and Carnett's sign. Psych: Age appropriate judgment and insight. Normal mood and affect.  Abdominal bloating - Plan: dicyclomine (BENTYL) 10 MG capsule  Nausea and vomiting, intractability of vomiting not specified, unspecified vomiting type - Plan: ondansetron (ZOFRAN) 4 MG tablet  Diarrhea, unspecified type - Plan: CBC, Comprehensive metabolic panel, Stool Culture, Ova and parasite examination  Ck labs and stool culture, push fluids w electrolytes, monitor food choices.  If above workup neg and he is still having issues, will consider CT abd vs referral to GI.  F/u prn.  Pt voiced understanding and agreement to the plan.  Jilda Rocheicholas Paul [place], DO 07/21/18 12:11 PM

## 2018-07-21 NOTE — Patient Instructions (Signed)
Give us 4-5 business days to get the results of your labs back.   Stay hydrated. Consider Gatorade/Powerade/Pedialyte while you are still having diarrhea.  Play around with food/beverage types that upset your stomach.   Let us know if you need anything.

## 2018-07-21 NOTE — Telephone Encounter (Signed)
Copied from CRM [phone]. Topic: Appointment Scheduling - Scheduling Inquiry for Clinic >> Jul 21, 2018  9:25 AM Arlyss Gandyichardson, Taren N, NT wrote: Reason for CRM: Pt would like to see if he can be worked in today with Dr. Carmelia RollerWendling for some stomach issues he has had going on for a week and for weakness due to the stomach issues. Please advise.  Patient contacted and will be here at 11:30

## 2018-07-27 LAB — OVA AND PARASITE EXAMINATION
CONCENTRATE RESULT:: NONE SEEN
SPECIMEN QUALITY:: ADEQUATE
TRICHROME RESULT: NONE SEEN
VKL: 91385768

## 2018-07-27 LAB — STOOL CULTURE
MICRO NUMBER: 91385767
MICRO NUMBER:: 91385766
MICRO NUMBER:: 91385769
SHIGA RESULT:: NOT DETECTED
SPECIMEN QUALITY: ADEQUATE
SPECIMEN QUALITY:: ADEQUATE
SPECIMEN QUALITY:: ADEQUATE

## 2019-07-06 ENCOUNTER — Other Ambulatory Visit: Payer: Self-pay

## 2019-07-07 ENCOUNTER — Ambulatory Visit (INDEPENDENT_AMBULATORY_CARE_PROVIDER_SITE_OTHER): Payer: 59 | Admitting: Family Medicine

## 2019-07-07 ENCOUNTER — Encounter: Payer: Self-pay | Admitting: Family Medicine

## 2019-07-07 VITALS — BP 120/78 | HR 100 | Temp 97.1°F | Ht 70.0 in | Wt 233.2 lb

## 2019-07-07 DIAGNOSIS — R591 Generalized enlarged lymph nodes: Secondary | ICD-10-CM

## 2019-07-07 MED ORDER — AMOXICILLIN-POT CLAVULANATE 875-125 MG PO TABS: 1.0000 | ORAL_TABLET | Freq: Two times a day (BID) | ORAL | 0 refills | Status: DC

## 2019-07-07 MED ORDER — PREDNISONE 20 MG PO TABS: 40.0000 mg | ORAL_TABLET | Freq: Every day | ORAL | 0 refills | Status: AC

## 2019-07-07 NOTE — Patient Instructions (Signed)
Please consider counseling. Contact [phone] to schedule an appointment or inquire about cost/insurance coverage.  Use the prednisone first. If no better, use the antibiotic (by Friday). If no better by later next week, I need to see you again  Let us know if you need anything.

## 2019-07-07 NOTE — Progress Notes (Signed)
Chief Complaint  Patient presents with  . Neck Pain    swollen lymph nodes    Jeremy Peterson is a 33 y.o. male here for a skin complaint.  Duration: 4 days Location: behind ears Pruritic? No Painful? Yes Drainage? No New soaps/lotions/topicals/detergents? No Sick contacts? No Other associated symptoms: hard areas; has not been sick, denies sore throat or trouble swallowing, no other URI s/s's Therapies tried thus far: Tramadol, Mobic  ROS:  Const: No fevers Skin: As noted in HPI  Past Medical History:  Diagnosis Date  . Anxiety   . DEPRESSION   . Nephrolithiasis   . PAC (premature atrial contraction)     BP 120/78 (BP Location: Left Arm, Patient Position: Sitting, Cuff Size: Normal)   Pulse 100   Temp (!) 97.1 F (36.2 C) (Temporal)   Ht 5\' 10"  (1.778 m)   Wt 233 lb 4 oz (105.8 kg)   SpO2 96%   BMI 33.47 kg/m  Gen: awake, alert, appearing stated age Lungs: No accessory muscle use Skin: enlarged LN's posterior to ear and near angle of mandible b/l. +TTP. Freely moveable. No drainage, erythema, fluctuance, excoriation Psych: Age appropriate judgment and insight  Lymphadenopathy - Plan: amoxicillin-clavulanate (AUGMENTIN) 875-125 MG tablet, predniSONE (DELTASONE) 20 MG tablet  Pred burst. If no improvement over next several days, will take amox-clav. If no improvement by next week, will need to reck in office.  The patient voiced understanding and agreement to the plan.  [place], DO 07/07/19 9:49 AM

## 2019-07-16 ENCOUNTER — Other Ambulatory Visit: Payer: Self-pay

## 2019-07-16 ENCOUNTER — Encounter (HOSPITAL_BASED_OUTPATIENT_CLINIC_OR_DEPARTMENT_OTHER): Payer: Self-pay | Admitting: Emergency Medicine

## 2019-07-16 ENCOUNTER — Emergency Department (HOSPITAL_BASED_OUTPATIENT_CLINIC_OR_DEPARTMENT_OTHER)
Admission: EM | Admit: 2019-07-16 | Discharge: 2019-07-16 | Disposition: A | Discharge status: Home / Self Care | Payer: 59 | Attending: Emergency Medicine | Admitting: Emergency Medicine

## 2019-07-16 ENCOUNTER — Emergency Department (HOSPITAL_BASED_OUTPATIENT_CLINIC_OR_DEPARTMENT_OTHER): Payer: 59

## 2019-07-16 DIAGNOSIS — Z79899 Other long term (current) drug therapy: Secondary | ICD-10-CM | POA: Diagnosis not present

## 2019-07-16 DIAGNOSIS — Z87891 Personal history of nicotine dependence: Secondary | ICD-10-CM | POA: Insufficient documentation

## 2019-07-16 DIAGNOSIS — R002 Palpitations: Secondary | ICD-10-CM | POA: Diagnosis present

## 2019-07-16 LAB — CBC WITH DIFFERENTIAL/PLATELET
Abs Immature Granulocytes: 0.03 10*3/uL (ref 0.00–0.07)
Basophils Absolute: 0 10*3/uL (ref 0.0–0.1)
Basophils Relative: 1 %
Eosinophils Absolute: 0.2 10*3/uL (ref 0.0–0.5)
Eosinophils Relative: 2 %
HCT: 43.5 % (ref 39.0–52.0)
Hemoglobin: 15.1 g/dL (ref 13.0–17.0)
Immature Granulocytes: 0 %
Lymphocytes Relative: 44 %
Lymphs Abs: 3.3 10*3/uL (ref 0.7–4.0)
MCH: 29.7 pg (ref 26.0–34.0)
MCHC: 34.7 g/dL (ref 30.0–36.0)
MCV: 85.5 fL (ref 80.0–100.0)
Monocytes Absolute: 0.5 10*3/uL (ref 0.1–1.0)
Monocytes Relative: 6 %
Neutro Abs: 3.6 10*3/uL (ref 1.7–7.7)
Neutrophils Relative %: 47 %
Platelets: 221 10*3/uL (ref 150–400)
RBC: 5.09 MIL/uL (ref 4.22–5.81)
RDW: 12.3 % (ref 11.5–15.5)
WBC: 7.6 10*3/uL (ref 4.0–10.5)
nRBC: 0 % (ref 0.0–0.2)

## 2019-07-16 LAB — BASIC METABOLIC PANEL
Anion gap: 9 (ref 5–15)
BUN: 13 mg/dL (ref 6–20)
CO2: 21 mmol/L — ABNORMAL LOW (ref 22–32)
Calcium: 8.7 mg/dL — ABNORMAL LOW (ref 8.9–10.3)
Chloride: 105 mmol/L (ref 98–111)
Creatinine, Ser: 0.85 mg/dL (ref 0.61–1.24)
GFR calc Af Amer: >60 mL/min (ref >60)
GFR calc non Af Amer: >60 mL/min (ref >60)
Glucose, Bld: 130 mg/dL — ABNORMAL HIGH (ref 70–99)
Potassium: 3.7 mmol/L (ref 3.5–5.1)
Sodium: 135 mmol/L (ref 135–145)

## 2019-07-16 LAB — TROPONIN I (HIGH SENSITIVITY): Troponin I (High Sensitivity): <2 ng/L (ref <18)

## 2019-07-16 MED ORDER — ALUM & MAG HYDROXIDE-SIMETH 200-200-20 MG/5ML PO SUSP
30.0000 mL | Freq: Once | ORAL | Status: AC
  Administered 2019-07-16: 30 mL via ORAL
  Filled 2019-07-16: qty 30

## 2019-07-16 NOTE — ED Triage Notes (Signed)
Pt while trying to go to sleep he felt like his heart was racing and tightness in neck. Symptoms have not resolved.

## 2019-07-16 NOTE — ED Provider Notes (Signed)
[facility] EMERGENCY DEPARTMENT Provider Note   CSN: [id number] Arrival date & time: 07/16/19  0236     History   Chief Complaint Chief Complaint  Patient presents with   Palpitations    HPI Jeremy Peterson is a 33 y.o. male.     The history is provided by the patient.  Palpitations Palpitations quality:  Fast Onset quality:  Sudden Timing:  Constant Progression:  Unchanged Chronicity:  Recurrent Context: anxiety   Context: not appetite suppressants, not blood loss, not bronchodilators, not caffeine, not dehydration, not exercise, not hyperventilation, not illicit drugs, not nicotine and not stimulant use   Context comment:  Took someone elses mobic and then went to sleep.  was recently on steroids.  Relieved by:  Nothing Worsened by:  Nothing Ineffective treatments:  None tried Associated symptoms: no back pain, no chest pain, no chest pressure, no cough, no diaphoresis, no dizziness, no hemoptysis, no leg pain, no lower extremity edema, no malaise/fatigue, no nausea, no near-syncope, no numbness, no orthopnea, no PND, no shortness of breath, no syncope, no vomiting and no weakness   Risk factors: no diabetes mellitus, no heart disease and no hx of PE   Patient with known anxiety presents with palpitation that woke him from sleep.  He states this week he came off antibiotics and steroids for lymphadenitis.  He has been feeling better but took someone's mobic for his chronic neck pain and then went to sleep.  He awoke with the palpitations and neck tightness and dry mouth and sense he couldn't swallow.  No rashes no swelling of the mouth lips or throat.  No SOB.    Past Medical History:  Diagnosis Date   Anxiety    DEPRESSION    Nephrolithiasis    PAC (premature atrial contraction)     Patient Active Problem List   Diagnosis Date Noted   Palpitations 05/06/2013   DEPRESSION 10/27/2010   FATIGUE 10/27/2010   DIABETES MELLITUS, BORDERLINE  10/27/2010    Past Surgical History:  Procedure Laterality Date   APPENDECTOMY     CHOLECYSTECTOMY     SURGERY SCROTAL / TESTICULAR          Home Medications    Prior to Admission medications   Medication Sig Start Date End Date Taking? Authorizing Provider  loratadine (CLARITIN) 10 MG tablet Take 10 mg by mouth daily.   Yes [provider]    Family History Family History  Problem Relation Age of Onset   Heart disease Paternal Grandfather    Hyperlipidemia Father    Hypertension Father    Cancer Maternal Grandfather     Social History Social History   Tobacco Use   Smoking status: Former Smoker   Smokeless tobacco: Never Used  Substance Use Topics   Alcohol use: No   Drug use: No    Comment: Previous substance abuse     Allergies   Patient has no known allergies.   Review of Systems Review of Systems  Constitutional: Negative for diaphoresis and malaise/fatigue.  HENT: Negative for congestion.   Eyes: Negative for visual disturbance.  Respiratory: Negative for cough, hemoptysis and shortness of breath.   Cardiovascular: Positive for palpitations. Negative for chest pain, orthopnea, syncope, PND and near-syncope.  Gastrointestinal: Negative for nausea and vomiting.  Genitourinary: Negative for difficulty urinating.  Musculoskeletal: Negative for back pain.  Neurological: Negative for dizziness, weakness and numbness.  Psychiatric/Behavioral: The patient is nervous/anxious.   All other systems reviewed and are  negative.    Physical Exam Updated Vital Signs BP (!) 148/94    Pulse 91    Temp (!) 97.5 F (36.4 C) (Oral)    Resp 17    Ht  (1.778 m)    Wt 106.6 kg    SpO2 100%    BMI 33.72 kg/m   Physical Exam Vitals signs and nursing note reviewed.  Constitutional:      General: He is not in acute distress.    Appearance: Normal appearance.  HENT:     Head: Normocephalic and atraumatic.     Nose: Nose normal.      Mouth/Throat:     Mouth: Mucous membranes are moist.     Pharynx: Oropharynx is clear.     Comments: No swelling of the lips tongue or uvula.  Eyes:     Conjunctiva/sclera: Conjunctivae normal.     Pupils: Pupils are equal, round, and reactive to light.  Neck:     Musculoskeletal: Normal range of motion and neck supple. No neck rigidity or muscular tenderness.     Vascular: No carotid bruit.  Cardiovascular:     Rate and Rhythm: Normal rate and regular rhythm.     Pulses: Normal pulses.     Heart sounds: Normal heart sounds.  Pulmonary:     Effort: Pulmonary effort is normal. No respiratory distress.     Breath sounds: Normal breath sounds. No stridor. No wheezing or rales.  Abdominal:     General: Abdomen is flat. Bowel sounds are normal.     Tenderness: There is no abdominal tenderness. There is no guarding or rebound.  Musculoskeletal: Normal range of motion.        General: No swelling.  Lymphadenopathy:     Cervical: No cervical adenopathy.  Skin:    General: Skin is warm and dry.     Capillary Refill: Capillary refill takes less than 2 seconds.     Findings: No rash.  Neurological:     General: No focal deficit present.     Mental Status: He is alert and oriented to person, place, and time.     Deep Tendon Reflexes: Reflexes normal.  Psychiatric:        Mood and Affect: Mood is anxious.      ED Treatments / Results  Labs (all labs ordered are listed, but only abnormal results are displayed) Results for orders placed or performed during the hospital encounter of 07/16/19  CBC with Differential  Result Value Ref Range   WBC 7.6 4.0 - 10.5 K/uL   RBC 5.09 4.22 - 5.81 MIL/uL   Hemoglobin 15.1 13.0 - 17.0 g/dL   HCT 40.9 81.1 - 91.4 %   MCV 85.5 80.0 - 100.0 fL   MCH 29.7 26.0 - 34.0 pg   MCHC 34.7 30.0 - 36.0 g/dL   RDW 78.2 95.6 - 21.3 %   Platelets 221 150 - 400 K/uL   nRBC 0.0 0.0 - 0.2 %   Neutrophils Relative % 47 %   Neutro Abs 3.6 1.7 - 7.7 K/uL    Lymphocytes Relative 44 %   Lymphs Abs 3.3 0.7 - 4.0 K/uL   Monocytes Relative 6 %   Monocytes Absolute 0.5 0.1 - 1.0 K/uL   Eosinophils Relative 2 %   Eosinophils Absolute 0.2 0.0 - 0.5 K/uL   Basophils Relative 1 %   Basophils Absolute 0.0 0.0 - 0.1 K/uL   Immature Granulocytes 0 %   Abs Immature Granulocytes 0.03 0.00 -  0.07 K/uL  Basic metabolic panel  Result Value Ref Range   Sodium 135 135 - 145 mmol/L   Potassium 3.7 3.5 - 5.1 mmol/L   Chloride 105 98 - 111 mmol/L   CO2 21 (L) 22 - 32 mmol/L   Glucose, Bld 130 (H) 70 - 99 mg/dL   BUN 13 6 - 20 mg/dL   Creatinine, Ser 0.85 0.61 - 1.24 mg/dL   Calcium 8.7 (L) 8.9 - 10.3 mg/dL   GFR calc non Af Amer >60 >60 mL/min   GFR calc Af Amer >60 >60 mL/min   Anion gap 9 5 - 15   Dg Chest Portable 1 View  Result Date: 07/16/2019 CLINICAL DATA:  Pain EXAM: PORTABLE CHEST 1 VIEW COMPARISON:  June 30, 2016 FINDINGS: The heart size and mediastinal contours are within normal limits. Both lungs are clear. The visualized skeletal structures are unremarkable. IMPRESSION: No active disease. Electronically Signed   By: Prudencio Pair M.D.   On: 07/16/2019 03:52    EKG EKG Interpretation  Date/Time:  Thursday July 16 2019 02:43:23 EST Ventricular Rate:  93 PR Interval:    QRS Duration: 94 QT Interval:  344 QTC Calculation: 428 R Axis:   17 Text Interpretation: Sinus rhythm Confirmed by Randal Buba, Jayland Null ([id number]) on 07/16/2019 3:08:53 AM   Radiology No results found.  Procedures Procedures (including critical care time)  Medications Ordered in ED Medications  alum & mag hydroxide-simeth (MAALOX/MYLANTA) 200-200-20 MG/5ML suspension 30 mL (has no administration in time range)     Initial Impression / Assessment and Plan / ED Course   I suspect the medication gave him reflux and then he began to panic and this is what is going on at present.  He still is having symptoms but is in NSR on the monitor.  No signs of allergic  reaction.  PERC negative wells 0 highly doubt PE in this low risk patient.  I do not believe this is cardiac.  He has a normal EKG and troponin.  HEART score is 1 very low risk for MACE.  Patient is stable for discharge with close follow up.    Jeremy Peterson was evaluated in Emergency Department on 07/16/2019 for the symptoms described in the history of present illness. He was evaluated in the context of the global COVID-19 pandemic, which necessitated consideration that the patient might be at risk for infection with the SARS-CoV-2 virus that causes COVID-19. Institutional protocols and algorithms that pertain to the evaluation of patients at risk for COVID-19 are in a state of rapid change based on information released by regulatory bodies including the CDC and federal and state organizations. These policies and algorithms were followed during the patient's care in the ED.   Final Clinical Impressions(s) / ED Diagnoses    Return for intractable cough, coughing up blood,fevers >100.4 unrelieved by medication, shortness of breath, intractable vomiting, chest pain, shortness of breath, weakness,numbness, changes in speech, facial asymmetry,abdominal pain, passing out,Inability to tolerate liquids or food, cough, altered mental status or any concerns. No signs of systemic illness or infection. The patient is nontoxic-appearing on exam and vital signs are within normal limits.   I have reviewed the triage vital signs and the nursing notes. Pertinent labs &imaging results that were available during my care of the patient were reviewed by me and considered in my medical decision making (see chart for details).  After history, exam, and medical workup I feel the patient has been appropriately medically screened and is  safe for discharge home. Pertinent diagnoses were discussed with the patient. Patient was given return precautions   Jakwon Gayton, MD 07/16/19 [phone]

## 2019-07-16 NOTE — ED Triage Notes (Signed)
Pt states he just finished a round of steroids and abx for enlarged lymph nodes. Pt states he also took meloxicam today at 8 pm and has never taken this medication before.

## 2019-09-17 ENCOUNTER — Ambulatory Visit: Payer: 59 | Attending: Internal Medicine

## 2019-09-17 DIAGNOSIS — Z20822 Contact with and (suspected) exposure to covid-19: Secondary | ICD-10-CM

## 2019-09-18 LAB — NOVEL CORONAVIRUS, NAA: SARS-CoV-2, NAA: NOT DETECTED

## 2020-02-23 ENCOUNTER — Other Ambulatory Visit: Payer: Self-pay | Admitting: Urology

## 2020-03-10 ENCOUNTER — Other Ambulatory Visit: Payer: Self-pay

## 2020-03-10 ENCOUNTER — Encounter (HOSPITAL_BASED_OUTPATIENT_CLINIC_OR_DEPARTMENT_OTHER): Payer: Self-pay | Admitting: Urology

## 2020-03-10 NOTE — Progress Notes (Signed)
NEW Covid Policy July 2021  Surgery Day:  03-18-20  Facility:  [facility]  Type of Surgery: bilateral vasectomy  Fully Covid Vaccinated:   1)11-24-2019                                          2)12-22-2019                                          Where?[facility]                                           Type?moderrma Not Covid Vaccinated:  n/a  Do you have symptoms? no  In the past 14 days:        Have you had any symptoms? no       Have you been tested covid positive?no       Have you been in contact with someone covid positive?no        Is pt Immuno-compromised?no

## 2020-03-10 NOTE — Progress Notes (Signed)
Spoke w/ via phone for pre-op interview---pt Lab needs dos---- none            COVID test ------not needed fully vaccinated Arrive at -------1030 am 03-18-20 No food after midnight clear liquids from midnight until 930 am then npo Medications to take morning of surgery -----none Diabetic medication -----n/a Patient Special Instructions -----bring covid vaccination card day of surgery Pre-Op special Istructions -----none Patient verbalized understanding of instructions that were given at this phone interview. Patient denies shortness of breath, chest pain, fever, cough a this phone interview.

## 2020-03-18 ENCOUNTER — Ambulatory Visit (HOSPITAL_BASED_OUTPATIENT_CLINIC_OR_DEPARTMENT_OTHER): Payer: 59 | Admitting: Anesthesiology

## 2020-03-18 ENCOUNTER — Encounter (HOSPITAL_BASED_OUTPATIENT_CLINIC_OR_DEPARTMENT_OTHER): Payer: Self-pay | Admitting: Urology

## 2020-03-18 ENCOUNTER — Encounter (HOSPITAL_BASED_OUTPATIENT_CLINIC_OR_DEPARTMENT_OTHER)
Admission: RE | Disposition: A | Discharge status: Home / Self Care | Payer: Self-pay | Source: Home / Self Care | Attending: Urology

## 2020-03-18 ENCOUNTER — Ambulatory Visit (HOSPITAL_BASED_OUTPATIENT_CLINIC_OR_DEPARTMENT_OTHER)
Admission: RE | Admit: 2020-03-18 | Discharge: 2020-03-18 | Disposition: A | Discharge status: Home / Self Care | Payer: 59 | Attending: Urology | Admitting: Urology

## 2020-03-18 DIAGNOSIS — G43909 Migraine, unspecified, not intractable, without status migrainosus: Secondary | ICD-10-CM | POA: Diagnosis not present

## 2020-03-18 DIAGNOSIS — Z8249 Family history of ischemic heart disease and other diseases of the circulatory system: Secondary | ICD-10-CM | POA: Insufficient documentation

## 2020-03-18 DIAGNOSIS — Z6834 Body mass index (BMI) 34.0-34.9, adult: Secondary | ICD-10-CM | POA: Insufficient documentation

## 2020-03-18 DIAGNOSIS — F329 Major depressive disorder, single episode, unspecified: Secondary | ICD-10-CM | POA: Diagnosis not present

## 2020-03-18 DIAGNOSIS — F419 Anxiety disorder, unspecified: Secondary | ICD-10-CM | POA: Insufficient documentation

## 2020-03-18 DIAGNOSIS — Z302 Encounter for sterilization: Secondary | ICD-10-CM

## 2020-03-18 DIAGNOSIS — E669 Obesity, unspecified: Secondary | ICD-10-CM | POA: Insufficient documentation

## 2020-03-18 DIAGNOSIS — Z87891 Personal history of nicotine dependence: Secondary | ICD-10-CM | POA: Insufficient documentation

## 2020-03-18 HISTORY — DX: Personal history of urinary calculi: Z87.442

## 2020-03-18 HISTORY — DX: Concussion with loss of consciousness status unknown, initial encounter: S06.0XAA

## 2020-03-18 HISTORY — DX: Migraine, unspecified, not intractable, without status migrainosus: G43.909

## 2020-03-18 HISTORY — PX: VASECTOMY: SHX75

## 2020-03-18 SURGERY — VASECTOMY
Anesthesia: General | Site: Scrotum | Laterality: Bilateral

## 2020-03-18 MED ORDER — GLYCOPYRROLATE PF 0.2 MG/ML IJ SOSY
PREFILLED_SYRINGE | INTRAMUSCULAR | Status: AC
  Filled 2020-03-18: qty 1

## 2020-03-18 MED ORDER — LIDOCAINE HCL (CARDIAC) PF 100 MG/5ML IV SOSY
PREFILLED_SYRINGE | INTRAVENOUS | Status: DC | PRN
  Administered 2020-03-18: 100 mg via INTRAVENOUS

## 2020-03-18 MED ORDER — MIDAZOLAM HCL 5 MG/5ML IJ SOLN
INTRAMUSCULAR | Status: DC | PRN
  Administered 2020-03-18: 2 mg via INTRAVENOUS
  Administered 2020-03-18 (×2): 1 mg via INTRAVENOUS

## 2020-03-18 MED ORDER — CEFAZOLIN SODIUM-DEXTROSE 2-4 GM/100ML-% IV SOLN
INTRAVENOUS | Status: AC
  Filled 2020-03-18: qty 100

## 2020-03-18 MED ORDER — OXYCODONE-ACETAMINOPHEN 5-325 MG PO TABS: 1.0000 | ORAL_TABLET | ORAL | 0 refills | Status: DC | PRN

## 2020-03-18 MED ORDER — ONDANSETRON HCL 4 MG/2ML IJ SOLN
INTRAMUSCULAR | Status: DC | PRN
  Administered 2020-03-18: 4 mg via INTRAVENOUS

## 2020-03-18 MED ORDER — KETOROLAC TROMETHAMINE 30 MG/ML IJ SOLN
INTRAMUSCULAR | Status: DC | PRN
  Administered 2020-03-18: 30 mg via INTRAVENOUS

## 2020-03-18 MED ORDER — ONDANSETRON HCL 4 MG/2ML IJ SOLN: 4.0000 mg | Freq: Once | INTRAMUSCULAR | Status: DC | PRN

## 2020-03-18 MED ORDER — DEXAMETHASONE SODIUM PHOSPHATE 10 MG/ML IJ SOLN
INTRAMUSCULAR | Status: AC
  Filled 2020-03-18: qty 1

## 2020-03-18 MED ORDER — GLYCOPYRROLATE 0.2 MG/ML IJ SOLN
INTRAMUSCULAR | Status: DC | PRN
Start: 2020-03-18 — End: 2020-03-18
  Administered 2020-03-18: .1 mg via INTRAVENOUS

## 2020-03-18 MED ORDER — PROPOFOL 10 MG/ML IV BOLUS
INTRAVENOUS | Status: AC
  Filled 2020-03-18: qty 40

## 2020-03-18 MED ORDER — OXYCODONE HCL 5 MG PO TABS
5.0000 mg | ORAL_TABLET | Freq: Once | ORAL | Status: AC | PRN
  Administered 2020-03-18: 5 mg via ORAL

## 2020-03-18 MED ORDER — LIDOCAINE HCL 1 % IJ SOLN: INTRAMUSCULAR | Status: DC | PRN

## 2020-03-18 MED ORDER — LIDOCAINE 2% (20 MG/ML) 5 ML SYRINGE
INTRAMUSCULAR | Status: AC
  Filled 2020-03-18: qty 5

## 2020-03-18 MED ORDER — CEFAZOLIN SODIUM-DEXTROSE 2-4 GM/100ML-% IV SOLN
2.0000 g | Freq: Once | INTRAVENOUS | Status: AC
  Administered 2020-03-18: 2 g via INTRAVENOUS

## 2020-03-18 MED ORDER — LACTATED RINGERS IV SOLN: INTRAVENOUS | Status: DC

## 2020-03-18 MED ORDER — ACETAMINOPHEN 325 MG PO TABS: 325.0000 mg | ORAL_TABLET | ORAL | Status: DC | PRN

## 2020-03-18 MED ORDER — MEPERIDINE HCL 25 MG/ML IJ SOLN: 6.2500 mg | INTRAMUSCULAR | Status: DC | PRN

## 2020-03-18 MED ORDER — OXYCODONE HCL 5 MG PO TABS
ORAL_TABLET | ORAL | Status: AC
  Filled 2020-03-18: qty 1

## 2020-03-18 MED ORDER — MIDAZOLAM HCL 2 MG/2ML IJ SOLN
INTRAMUSCULAR | Status: AC
  Filled 2020-03-18: qty 2

## 2020-03-18 MED ORDER — FENTANYL CITRATE (PF) 100 MCG/2ML IJ SOLN: 25.0000 ug | INTRAMUSCULAR | Status: DC | PRN

## 2020-03-18 MED ORDER — KETOROLAC TROMETHAMINE 30 MG/ML IJ SOLN: 30.0000 mg | Freq: Once | INTRAMUSCULAR | Status: DC | PRN

## 2020-03-18 MED ORDER — FENTANYL CITRATE (PF) 100 MCG/2ML IJ SOLN
INTRAMUSCULAR | Status: AC
  Filled 2020-03-18: qty 2

## 2020-03-18 MED ORDER — OXYCODONE HCL 5 MG/5ML PO SOLN: 5.0000 mg | Freq: Once | ORAL | Status: AC | PRN

## 2020-03-18 MED ORDER — ACETAMINOPHEN 160 MG/5ML PO SOLN: 325.0000 mg | ORAL | Status: DC | PRN

## 2020-03-18 MED ORDER — FENTANYL CITRATE (PF) 100 MCG/2ML IJ SOLN
INTRAMUSCULAR | Status: DC | PRN
  Administered 2020-03-18: 100 ug via INTRAVENOUS

## 2020-03-18 MED ORDER — ONDANSETRON HCL 4 MG/2ML IJ SOLN
INTRAMUSCULAR | Status: AC
  Filled 2020-03-18: qty 2

## 2020-03-18 MED ORDER — DEXAMETHASONE SODIUM PHOSPHATE 10 MG/ML IJ SOLN
INTRAMUSCULAR | Status: DC | PRN
  Administered 2020-03-18: 10 mg via INTRAVENOUS

## 2020-03-18 MED ORDER — BUPIVACAINE HCL (PF) 0.25 % IJ SOLN
INTRAMUSCULAR | Status: DC | PRN
  Administered 2020-03-18: 3 mL

## 2020-03-18 MED ORDER — PROPOFOL 10 MG/ML IV BOLUS
INTRAVENOUS | Status: DC | PRN
  Administered 2020-03-18: 200 mg via INTRAVENOUS

## 2020-03-18 MED ORDER — KETOROLAC TROMETHAMINE 30 MG/ML IJ SOLN
INTRAMUSCULAR | Status: AC
  Filled 2020-03-18: qty 1

## 2020-03-18 SURGICAL SUPPLY — 37 items
BLADE SURG 15 STRL LF DISP TIS (BLADE) ×1 IMPLANT
BLADE SURG 15 STRL SS (BLADE) ×3
BNDG GAUZE ELAST 4 BULKY (GAUZE/BANDAGES/DRESSINGS) ×3 IMPLANT
CLIP VESOCCLUDE MED 6/CT (CLIP) ×6 IMPLANT
CLOTH BEACON ORANGE TIMEOUT ST (SAFETY) ×3 IMPLANT
COVER BACK TABLE 60X90IN (DRAPES) ×3 IMPLANT
COVER MAYO STAND STRL (DRAPES) ×3 IMPLANT
COVER WAND RF STERILE (DRAPES) ×3 IMPLANT
DERMABOND ADVANCED (GAUZE/BANDAGES/DRESSINGS)
DERMABOND ADVANCED .7 DNX12 (GAUZE/BANDAGES/DRESSINGS) IMPLANT
DRAPE LAPAROTOMY 100X72 PEDS (DRAPES) ×3 IMPLANT
DRSG TELFA 3X8 NADH (GAUZE/BANDAGES/DRESSINGS) ×3 IMPLANT
ELECT NEEDLE BLADE 2-5/6 (NEEDLE) ×3 IMPLANT
ELECT REM PT RETURN 9FT ADLT (ELECTROSURGICAL) ×3
ELECTRODE REM PT RTRN 9FT ADLT (ELECTROSURGICAL) ×1 IMPLANT
GAUZE SPONGE 4X4 12PLY STRL LF (GAUZE/BANDAGES/DRESSINGS) ×3 IMPLANT
GLOVE BIO SURGEON STRL SZ7.5 (GLOVE) ×3 IMPLANT
GLOVE BIO SURGEON STRL SZ8 (GLOVE) IMPLANT
GLOVE SURG SS PI 7.0 STRL IVOR (GLOVE) ×3 IMPLANT
GOWN STRL REUS W/ TWL LRG LVL3 (GOWN DISPOSABLE) ×1 IMPLANT
GOWN STRL REUS W/ TWL XL LVL3 (GOWN DISPOSABLE) ×1 IMPLANT
GOWN STRL REUS W/TWL LRG LVL3 (GOWN DISPOSABLE) ×6 IMPLANT
GOWN STRL REUS W/TWL XL LVL3 (GOWN DISPOSABLE) ×3
KIT TURNOVER CYSTO (KITS) ×3 IMPLANT
MANIFOLD NEPTUNE II (INSTRUMENTS) IMPLANT
NEEDLE HYPO 22GX1.5 SAFETY (NEEDLE) ×3 IMPLANT
PACK BASIN DAY SURGERY FS (CUSTOM PROCEDURE TRAY) ×3 IMPLANT
PENCIL BUTTON HOLSTER BLD 10FT (ELECTRODE) ×3 IMPLANT
SUPPORT SCROTAL LG STRP (MISCELLANEOUS) ×2 IMPLANT
SUPPORTER ATHLETIC LG (MISCELLANEOUS) ×1
SUT CHROMIC 3 0 PS 2 (SUTURE) ×3 IMPLANT
SUT VIC AB 2-0 PS2 27 (SUTURE) ×3 IMPLANT
SYR CONTROL 10ML LL (SYRINGE) ×3 IMPLANT
TOWEL OR 17X26 10 PK STRL BLUE (TOWEL DISPOSABLE) ×3 IMPLANT
TRAY DSU PREP LF (CUSTOM PROCEDURE TRAY) ×3 IMPLANT
TUBE CONNECTING 12'X1/4 (SUCTIONS)
TUBE CONNECTING 12X1/4 (SUCTIONS) IMPLANT

## 2020-03-18 NOTE — Transfer of Care (Signed)
Immediate Anesthesia Transfer of Care Note  Patient: Jeremy Peterson  Procedure(s) Performed: Procedure(s) (LRB): VASECTOMY (Bilateral)  Patient Location: PACU  Anesthesia Type: General  Level of Consciousness: awake, sedated, patient cooperative and responds to stimulation  Airway & Oxygen Therapy: Patient Spontanous Breathing and Patient connected to NC02 and soft FM   Post-op Assessment: Report given to PACU RN, Post -op Vital signs reviewed and stable and Patient moving all extremities  Post vital signs: Reviewed and stable  Complications: No apparent anesthesia complications

## 2020-03-18 NOTE — Discharge Instructions (Signed)
Vasectomy, Care After This sheet gives you information about how to care for yourself after your procedure. Your health care provider may also give you more specific instructions. If you have problems or questions, contact your health care provider. What can I expect after the procedure? After your procedure, it is common to have:  Mild pain, swelling, redness, or discomfort in your scrotum.  Some blood coming from your incisions or puncture sites for one or two days.  Blood in your semen. Follow these instructions at home: Medicines   Take over-the-counter and prescription medicines only as told by your health care provider.  Avoid taking NSAIDs such as aspirin and ibuprofen, because these medicines can make bleeding worse. Activity  For the first 2 days after surgery, avoid physical activity and exercise that require a lot of energy. Ask your health care provider what activities are safe for you.  Do not participate in sports or perform heavy physical labor until your pain has improved, or until your health care provider says it is okay.  Do not ejaculate for at least 1 week after the procedure, or as long as directed.  You may resume sexual activity 7-10 days after your procedure, or when your health care provider approves. Use a different method of birth control (contraception) until you have had test results that confirm that there is no sperm in your semen. It may take a few months for all the sperm in the system to wash out.  Scrotal support  Use scrotal support, such as a jock strap or underwear with a supportive pouch, as needed for one week after your procedure.  If you feel discomfort in your scrotum, you may remove the scrotal support to see if the discomfort is relieved. Sometimes scrotal support can press on the scrotum and cause or worsen discomfort.  If your skin gets irritated, you may add some germ-free (sterile), fluffed bandages or a clean washcloth to the scrotal  support. General instructions  Put ice on the injured area: ? Put ice in a plastic bag. ? Place a towel between your skin and the bag. ? Leave the ice on for 20 minutes, 2-3 times a day.  Check your incisions or puncture sites every day for signs of infection. Check for: ? Redness, swelling, or pain. ? Fluid or blood. ? Warmth. ? Pus or a bad smell.  Leave stitches (sutures) in place. The sutures will dissolve on their own and do not need to be removed.  Keep all follow-up visits as told by your health care provider. This is important because you will need a test to confirm that there is no sperm in your semen. Multiple ejaculations are needed to clear out sperm that were beyond the vasectomy site. You will need one test result showing that there is no sperm in your semen before you can resume unprotected sex. This may take 2-4 months after your procedure.  Do not drive for 24 hours if you were given a sedative to help you relax. Contact a health care provider if:  You have redness, swelling, or more pain around your incision or puncture site, or in your scrotum area in general.  You have bleeding from your incision or puncture site.  You have pus or a bad smell coming from your incision or puncture site.  You have a fever.  Your incision or puncture site opens up. Get help right away if:  You develop a rash.  You have difficulty breathing. Summary  After  your procedure it is common to have mild pain, swelling, redness, or discomfort in your scrotum.  Avoid physical activity and exercise that requires a lot of energy for the first 2 days after surgery.  Put ice on the injured area. Leave the ice on for 20 minutes, 2-3 times a day.  Do not drive for 24 hours if you were given a sedative to help you relax. This information is not intended to replace advice given to you by your health care provider. Make sure you discuss any questions you have with your health care  provider. Document Revised: 08/02/2017 Document Reviewed: 11/16/2016 Elsevier Patient Education  2020 [employer].    Post Anesthesia Home Care Instructions  Activity: Get plenty of rest for the remainder of the day. A responsible individual must stay with you for 24 hours following the procedure.  For the next 24 hours, DO NOT: -Drive a car -Advertising copywriter -Drink alcoholic beverages -Take any medication unless instructed by your physician -Make any legal decisions or sign important papers.  Meals: Start with liquid foods such as gelatin or soup. Progress to regular foods as tolerated. Avoid greasy, spicy, heavy foods. If nausea and/or vomiting occur, drink only clear liquids until the nausea and/or vomiting subsides. Call your physician if vomiting continues.  Special Instructions/Symptoms: Your throat may feel dry or sore from the anesthesia or the breathing tube placed in your throat during surgery. If this causes discomfort, gargle with warm salt water. The discomfort should disappear within 24 hours.   May take Ibuprofen, Advil, Motrin, or Aleve starting at 7 PM as needed for pain.

## 2020-03-18 NOTE — Anesthesia Procedure Notes (Signed)
Procedure Name: LMA Insertion Date/Time: 03/18/2020 12:08 PM Performed by: Jessica Priest, CRNA Pre-anesthesia Checklist: Patient identified, Emergency Drugs available, Suction available, Patient being monitored and Timeout performed Patient Re-evaluated:Patient Re-evaluated prior to induction Oxygen Delivery Method: Circle system utilized Preoxygenation: Pre-oxygenation with 100% oxygen Induction Type: IV induction Ventilation: Mask ventilation without difficulty LMA: LMA inserted LMA Size: 5.0 Number of attempts: 1 Airway Equipment and Method: Bite block Placement Confirmation: positive ETCO2,  CO2 detector and breath sounds checked- equal and bilateral Tube secured with: Tape Dental Injury: Teeth and Oropharynx as per pre-operative assessment

## 2020-03-18 NOTE — Anesthesia Postprocedure Evaluation (Signed)
Anesthesia Post Note  Patient: Jeremy Peterson  Procedure(s) Performed: VASECTOMY (Bilateral Scrotum)     Patient location during evaluation: Phase II Anesthesia Type: General Level of consciousness: awake Pain management: pain level controlled Vital Signs Assessment: post-procedure vital signs reviewed and stable Respiratory status: spontaneous breathing Cardiovascular status: stable Postop Assessment: no apparent nausea or vomiting Anesthetic complications: no   No complications documented.  Last Vitals:  Vitals:   03/18/20 1315 03/18/20 1330  BP: 121/86 121/78  Pulse: 75 68  Resp: 16 13  Temp:    SpO2: 100% 95%    Last Pain:  Vitals:   03/18/20 1354  TempSrc:   PainSc: 6                  John F Scharlene Corn

## 2020-03-18 NOTE — Anesthesia Preprocedure Evaluation (Signed)
Anesthesia Evaluation  Patient identified by MRN, date of birth, ID band Patient awake    Reviewed: Allergy & Precautions, NPO status , Patient's Chart, lab work & pertinent test results  Airway Mallampati: I       Dental no notable dental hx.    Pulmonary former smoker,    Pulmonary exam normal        Cardiovascular Normal cardiovascular exam     Neuro/Psych    GI/Hepatic negative GI ROS, Neg liver ROS,   Endo/Other  negative endocrine ROS  Renal/GU   negative genitourinary   Musculoskeletal negative musculoskeletal ROS (+)   Abdominal (+) + obese,   Peds  Hematology   Anesthesia Other Findings   Reproductive/Obstetrics                             Anesthesia Physical Anesthesia Plan  ASA: II  Anesthesia Plan: General   Post-op Pain Management:    Induction: Intravenous  PONV Risk Score and Plan: 4 or greater and Ondansetron, Dexamethasone and Midazolam  Airway Management Planned: LMA  Additional Equipment: None  Intra-op Plan:   Post-operative Plan: Extubation in OR  Informed Consent: I have reviewed the patients History and Physical, chart, labs and discussed the procedure including the risks, benefits and alternatives for the proposed anesthesia with the patient or authorized representative who has indicated his/her understanding and acceptance.     Dental advisory given  Plan Discussed with: CRNA  Anesthesia Plan Comments:         Anesthesia Quick Evaluation

## 2020-03-18 NOTE — Op Note (Signed)
Preoperative diagnosis: Encounter for sterilization Postoperative diagnosis: Encounter for sterilization/vasectomy  Procedure: Bilateral vasectomy  Indication for procedure: Jeremy Peterson is a 34 year old male who desires sterilization.  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  It was difficult to locate the right vas deferens while the patient was awake and it was tender to palpation, therefore he was brought for anesthesia.  Findings: Right testicle more relaxed in the operating room but still difficult to locate right vas.  It appeared to me the convoluted vas was transected.  I carefully located the right side 3 or 4 times prior to transection.  Left vas deferens standard location and transection.  Description of procedure: After consent was obtained patient brought to the operating room and placed supine.  After adequate anesthesia the external genitalia were prepped and draped in the usual sterile fashion.  The right cord was grasped and the right vas located.  Local anesthetic was infiltrated in the skin and around the cord.  A stab incision was made in the right hemiscrotum and the skin open with the sharp hemostats.  The vas clamp was placed around the vas and the vas delivered.  Dissection was carried out and it appeared the convoluted vas was developing.  Hemostasis was insured and allow this to drop back in the right hemiscrotum.  I tried to get more superior to find a smooth part of the a sending vas but Dissecting out part of the convoluted vas.  It was dissected out clearly the ends transected and a small segment removed.  The testicular end was cauterized and it was allowed to fall back in the fashion and a clip was used to close the fascia over the testicular end.  The abdominal end retracted almost immediately after transection as it was under some tension.  The vasal sheath was clipped toward the abdominal side as well.  Adequate hemostasis was ensured  and the vas was allowed to fall back in the right hemiscrotum.  I needed a separate stab incision after the left vas deferens was located.  It too was grasped with a ring clamp and brought out.  The fascia was dissected off the vas and dissection cleared.  A section was transected and the vasa lumen was clearly probed.  The testicular and abdominal wound were cauterized allowed to drop back in the sheath and clips placed on both sides.  Hemostasis was ensured and the left vas was allowed to drop back into the left hemiscrotum.  The skin incisions were closed with 2 interrupted chromic sutures per incision.  Fluffs and a jockstrap were placed and he was awakened taken recovery room in stable condition.  Complications: None  Blood loss: Minimal  Specimens: None  Drains: None  Disposition: Patient stable to PACU.  I discussed with the patient preoperatively to be sure to use of the birth control until we ensure the procedure was successful.  Also called [name] and discussed the difficulty with the case and to be sure continue his other birth control and that the collection timing and instructions were in the green bag.  We also went over postop expectations and restrictions.

## 2020-03-18 NOTE — H&P (Signed)
H&P  Chief Complaint: Desires sterilization  History of Present Illness: Jeremy Peterson is a 34 year old male who desires sterilization.  He has 6 children with 4 biologic and 2 adopted.  He and his wife have considered vasectomy for about a year.  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  The right testicle was about half the size of the left and high riding.  It is difficult to find the vas on the right.  He was brought for sedation and vasectomy today.  He is well without complaint.  No fever, cough or colds.  Past Medical History:  Diagnosis Date  . Anxiety   . Concussion    multiple from college wrestling, no residual  . DEPRESSION   . History of kidney stones   . Migraines   . Nephrolithiasis   . PAC (premature atrial contraction)    no cardiology no treatment needed last pac few yrs ago   Past Surgical History:  Procedure Laterality Date  . APPENDECTOMY  2011  . CHOLECYSTECTOMY  2018  . SURGERY SCROTAL / TESTICULAR  age 72   right    Home Medications:  No medications prior to admission.   Allergies: No Known Allergies  Family History  Problem Relation Age of Onset  . Heart disease Paternal Grandfather   . Hyperlipidemia Father   . Hypertension Father   . Cancer Maternal Grandfather    Social History:  reports that he quit smoking about 7 years ago. His smoking use included cigarettes. He has a 5.00 pack-year smoking history. He has quit using smokeless tobacco. He reports current drug use. Drug: Benzodiazepines. He reports that he does not drink alcohol.  ROS: A complete review of systems was performed.  All systems are negative except for pertinent findings as noted. Review of Systems  All other systems reviewed and are negative.    Physical Exam:  Vital signs in last 24 hours: Temp:  [97.7 F (36.5 C)] 97.7 F (36.5 C) (07/16 1047) Pulse Rate:  [74] 74 (07/16 1047) Resp:  [18] 18 (07/16 1047) BP: (136)/(90) 136/90 (07/16  1047) SpO2:  [99 %] 99 % (07/16 1047) Weight:  [107.7 kg] 107.7 kg (07/16 1047) General:  Alert and oriented, No acute distress HEENT: Normocephalic, atraumatic Cardiovascular: Regular rate and rhythm Lungs: Regular rate and effort Abdomen: Soft, nontender, nondistended, no abdominal masses Back: No CVA tenderness Extremities: No edema Neurologic: Grossly intact GU: Penis circumcised and without mass or lesion.  Scrotum normal.  Right testicle is high riding and tender when trying to pull it down and locate the vas deferens.  Right testicle about half the size of the left but still a decent size of about 2-3 cm.  Left testicle palpably normal. Left vas palpably normal.   Laboratory Data:  No results found for this or any previous visit (from the past 24 hour(s)). No results found for this or any previous visit (from the past 240 hour(s)). Creatinine: No results for input(s): CREATININE in the last 168 hours.  Impression/Assessment:  Vasectomy evaluation   Plan:  We went over again the nature risk benefits and alternatives to vasectomy.  I reminded him vasectomy should be considered permanent and does not work immediately.  Discussed importance of continuing birth control after the procedure until semen analysis shows no sperm which might take several months.  I should add he said the right testicle usually has some tenderness and feels too tight or stretched. This testicle is  smaller and high riding. We did discuss right orchiectomy, but he declined.  All questions answered.  Patient elects to proceed with bilateral vasectomy.    Jerilee Field 03/18/2020, 11:28 AM

## 2020-03-21 ENCOUNTER — Encounter (HOSPITAL_BASED_OUTPATIENT_CLINIC_OR_DEPARTMENT_OTHER): Payer: Self-pay | Admitting: Urology

## 2020-06-07 ENCOUNTER — Other Ambulatory Visit: Payer: Self-pay | Admitting: Family Medicine

## 2020-06-07 MED ORDER — AMOXICILLIN 500 MG PO CAPS
1000.0000 mg | ORAL_CAPSULE | Freq: Every day | ORAL | 0 refills | Status: DC
Start: 2020-06-07 — End: 2020-09-05

## 2020-09-05 ENCOUNTER — Ambulatory Visit (HOSPITAL_BASED_OUTPATIENT_CLINIC_OR_DEPARTMENT_OTHER)
Admission: RE | Admit: 2020-09-05 | Discharge: 2020-09-05 | Disposition: A | Discharge status: Home / Self Care | Payer: 59 | Source: Ambulatory Visit | Attending: Family Medicine | Admitting: Family Medicine

## 2020-09-05 ENCOUNTER — Other Ambulatory Visit: Payer: Self-pay

## 2020-09-05 ENCOUNTER — Ambulatory Visit (INDEPENDENT_AMBULATORY_CARE_PROVIDER_SITE_OTHER): Payer: 59 | Admitting: Family Medicine

## 2020-09-05 ENCOUNTER — Encounter: Payer: Self-pay | Admitting: Family Medicine

## 2020-09-05 VITALS — BP 120/80 | HR 88 | Temp 98.3°F | Resp 18 | Ht 70.0 in | Wt 255.0 lb

## 2020-09-05 DIAGNOSIS — R109 Unspecified abdominal pain: Secondary | ICD-10-CM | POA: Insufficient documentation

## 2020-09-05 MED ORDER — OXYCODONE HCL 5 MG PO CAPS: 5.0000 mg | ORAL_CAPSULE | Freq: Four times a day (QID) | ORAL | 0 refills | Status: DC | PRN

## 2020-09-05 MED ORDER — CYCLOBENZAPRINE HCL 10 MG PO TABS: 5.0000 mg | ORAL_TABLET | Freq: Three times a day (TID) | ORAL | 0 refills | Status: DC | PRN

## 2020-09-05 MED ORDER — KETOROLAC TROMETHAMINE 60 MG/2ML IM SOLN
60.0000 mg | Freq: Once | INTRAMUSCULAR | Status: AC
  Administered 2020-09-05: 60 mg via INTRAMUSCULAR

## 2020-09-05 MED ORDER — MELOXICAM 15 MG PO TABS: 15.0000 mg | ORAL_TABLET | Freq: Every day | ORAL | 0 refills | Status: DC

## 2020-09-05 NOTE — Progress Notes (Signed)
Musculoskeletal Exam  Patient: Jeremy Peterson DOB: 01-26-1986  DOS: 09/05/2020  SUBJECTIVE:  Chief Complaint:   Chief Complaint  Patient presents with  . Back Pain    Pt states having a job injury x1 month and then had a cold a week ago. Pt states having a strong cough and when he coughed to hard he felt pop in his mid back. Pt states no pain going down the legs.     Jeremy Peterson is a 35 y.o.  male for evaluation and treatment of his back pain.   Onset:  1 month ago.  Tweaked back at work, coughed later and felt a pop where things got worse.  Location: lower R and now L Character:  aching and sharp  Progression of issue:  has worsened Associated symptoms: no bruising, swelling, redness Denies bowel/bladder incontinence or weakness Treatment: to date has been muscle relaxers, heat, left over oxy.   Neurovascular symptoms: no  He does have a hx of kidney stones and worries this may be related (on the R side).   Past Medical History:  Diagnosis Date  . Anxiety   . Concussion    multiple from college wrestling, no residual  . DEPRESSION   . History of kidney stones   . Migraines   . Nephrolithiasis   . PAC (premature atrial contraction)    no cardiology no treatment needed last pac few yrs ago    Objective:  VITAL SIGNS: BP 120/80 (BP Location: Right Arm, Patient Position: Sitting, Cuff Size: Normal)   Pulse 88   Temp 98.3 F (36.8 C) (Oral)   Resp 18   Ht 5\' 10"  (1.778 m)   Wt 255 lb (115.7 kg)   SpO2 99%   BMI 36.59 kg/m  Constitutional: Well formed, well developed. No acute distress. HENT: Normocephalic, atraumatic.  Thorax & Lungs:  No accessory muscle use Skin: Warm. Dry. No erythema. No rash.  Musculoskeletal: low back.   Tenderness to palpation: yes over R CVA over flank Deformity: no Ecchymosis: no Straight leg test: negative for Poor hamstring flexibility b/l. Neurologic: Normal sensory function. No focal deficits noted. DTR's equal and symmetric  in LE's. No clonus. Psychiatric: Normal mood. Age appropriate judgment and insight. Alert & oriented x 3.    Assessment:  Flank pain - Plan: meloxicam (MOBIC) 15 MG tablet, cyclobenzaprine (FLEXERIL) 10 MG tablet, oxycodone (OXY-IR) 5 MG capsule, DG Abd 1 View, ketorolac (TORADOL) injection 60 mg  Plan: Ck XR, would add Flomax if stone, no hx of gout.  Stretches/exercises, heat, ice, Tylenol, start tomorrow for Mobic. Warnings about Flexeril and oxy verbalized and written down.  Letter for work given excusing for next week.  F/u prn. The patient voiced understanding and agreement to the plan.   [place], DO 09/05/20  2:56 PM

## 2020-09-05 NOTE — Patient Instructions (Addendum)
Ice/cold pack over area for 10-15 min twice daily.  Heat (pad or rice pillow in microwave) over affected area, 10-15 minutes twice daily.   Take Flexeril (cyclobenzaprine) 1-2 hours before planned bedtime. If it makes you drowsy, do not take during the day. You can try half a tab the following night.  Do not drink alcohol, do any illicit/street drugs, drive or do anything that requires alertness while on the oxycodone.   Start the meloxicam tomorrow.   Let us know if you need anything.  EXERCISES  RANGE OF MOTION (ROM) AND STRETCHING EXERCISES - Low Back Pain Most people with lower back pain will find that their symptoms get worse with excessive bending forward (flexion) or arching at the lower back (extension). The exercises that will help resolve your symptoms will focus on the opposite motion.  If you have pain, numbness or tingling which travels down into your buttocks, leg or foot, the goal of the therapy is for these symptoms to move closer to your back and eventually resolve. Sometimes, these leg symptoms will get better, but your lower back pain may worsen. This is often an indication of progress in your rehabilitation. Be very alert to any changes in your symptoms and the activities in which you participated in the 24 hours prior to the change. Sharing this information with your caregiver will allow him or her to most efficiently treat your condition. These exercises may help you when beginning to rehabilitate your injury. Your symptoms may resolve with or without further involvement from your physician, physical therapist or athletic trainer. While completing these exercises, remember:   Restoring tissue flexibility helps normal motion to return to the joints. This allows healthier, less painful movement and activity.  An effective stretch should be held for at least 30 seconds.  A stretch should never be painful. You should only feel a gentle lengthening or release in the stretched  tissue. FLEXION RANGE OF MOTION AND STRETCHING EXERCISES:  STRETCH - Flexion, Single Knee to Chest   Lie on a firm bed or floor with both legs extended in front of you.  Keeping one leg in contact with the floor, bring your opposite knee to your chest. Hold your leg in place by either grabbing behind your thigh or at your knee.  Pull until you feel a gentle stretch in your low back. Hold 30 seconds.  Slowly release your grasp and repeat the exercise with the opposite side. Repeat 2 times. Complete this exercise 3 times per week.   STRETCH - Flexion, Double Knee to Chest  Lie on a firm bed or floor with both legs extended in front of you.  Keeping one leg in contact with the floor, bring your opposite knee to your chest.  Tense your stomach muscles to support your back and then lift your other knee to your chest. Hold your legs in place by either grabbing behind your thighs or at your knees.  Pull both knees toward your chest until you feel a gentle stretch in your low back. Hold 30 seconds.  Tense your stomach muscles and slowly return one leg at a time to the floor. Repeat 2 times. Complete this exercise 3 times per week.   STRETCH - Low Trunk Rotation  Lie on a firm bed or floor. Keeping your legs in front of you, bend your knees so they are both pointed toward the ceiling and your feet are flat on the floor.  Extend your arms out to the side. This  will stabilize your upper body by keeping your shoulders in contact with the floor.  Gently and slowly drop both knees together to one side until you feel a gentle stretch in your low back. Hold for 30 seconds.  Tense your stomach muscles to support your lower back as you bring your knees back to the starting position. Repeat the exercise to the other side. Repeat 2 times. Complete this exercise at least 3 times per week.   EXTENSION RANGE OF MOTION AND FLEXIBILITY EXERCISES:  STRETCH - Extension, Prone on Elbows   Lie on your  stomach on the floor, a bed will be too soft. Place your palms about shoulder width apart and at the height of your head.  Place your elbows under your shoulders. If this is too painful, stack pillows under your chest.  Allow your body to relax so that your hips drop lower and make contact more completely with the floor.  Hold this position for 30 seconds.  Slowly return to lying flat on the floor. Repeat 2 times. Complete this exercise 3 times per week.   RANGE OF MOTION - Extension, Prone Press Ups  Lie on your stomach on the floor, a bed will be too soft. Place your palms about shoulder width apart and at the height of your head.  Keeping your back as relaxed as possible, slowly straighten your elbows while keeping your hips on the floor. You may adjust the placement of your hands to maximize your comfort. As you gain motion, your hands will come more underneath your shoulders.  Hold this position 30 seconds.  Slowly return to lying flat on the floor. Repeat 2 times. Complete this exercise 3 times per week.   RANGE OF MOTION- Quadruped, Neutral Spine   Assume a hands and knees position on a firm surface. Keep your hands under your shoulders and your knees under your hips. You may place padding under your knees for comfort.  Drop your head and point your tailbone toward the ground below you. This will round out your lower back like an angry cat. Hold this position for 30 seconds.  Slowly lift your head and release your tail bone so that your back sags into a large arch, like an old horse.  Hold this position for 30 seconds.  Repeat this until you feel limber in your low back.  Now, find your "sweet spot." This will be the most comfortable position somewhere between the two previous positions. This is your neutral spine. Once you have found this position, tense your stomach muscles to support your low back.  Hold this position for 30 seconds. Repeat 2 times. Complete this  exercise 3 times per week.   STRENGTHENING EXERCISES - Low Back Sprain These exercises may help you when beginning to rehabilitate your injury. These exercises should be done near your "sweet spot." This is the neutral, low-back arch, somewhere between fully rounded and fully arched, that is your least painful position. When performed in this safe range of motion, these exercises can be used for people who have either a flexion or extension based injury. These exercises may resolve your symptoms with or without further involvement from your physician, physical therapist or athletic trainer. While completing these exercises, remember:   Muscles can gain both the endurance and the strength needed for everyday activities through controlled exercises.  Complete these exercises as instructed by your physician, physical therapist or athletic trainer. Increase the resistance and repetitions only as guided.  You may  experience muscle soreness or fatigue, but the pain or discomfort you are trying to eliminate should never worsen during these exercises. If this pain does worsen, stop and make certain you are following the directions exactly. If the pain is still present after adjustments, discontinue the exercise until you can discuss the trouble with your caregiver.  STRENGTHENING - Deep Abdominals, Pelvic Tilt   Lie on a firm bed or floor. Keeping your legs in front of you, bend your knees so they are both pointed toward the ceiling and your feet are flat on the floor.  Tense your lower abdominal muscles to press your low back into the floor. This motion will rotate your pelvis so that your tail bone is scooping upwards rather than pointing at your feet or into the floor. With a gentle tension and even breathing, hold this position for 3 seconds. Repeat 2 times. Complete this exercise 3 times per week.   STRENGTHENING - Abdominals, Crunches   Lie on a firm bed or floor. Keeping your legs in front of  you, bend your knees so they are both pointed toward the ceiling and your feet are flat on the floor. Cross your arms over your chest.  Slightly tip your chin down without bending your neck.  Tense your abdominals and slowly lift your trunk high enough to just clear your shoulder blades. Lifting higher can put excessive stress on the lower back and does not further strengthen your abdominal muscles.  Control your return to the starting position. Repeat 2 times. Complete this exercise 3 times per week.   STRENGTHENING - Quadruped, Opposite UE/LE Lift   Assume a hands and knees position on a firm surface. Keep your hands under your shoulders and your knees under your hips. You may place padding under your knees for comfort.  Find your neutral spine and gently tense your abdominal muscles so that you can maintain this position. Your shoulders and hips should form a rectangle that is parallel with the floor and is not twisted.  Keeping your trunk steady, lift your right hand no higher than your shoulder and then your left leg no higher than your hip. Make sure you are not holding your breath. Hold this position for 30 seconds.  Continuing to keep your abdominal muscles tense and your back steady, slowly return to your starting position. Repeat with the opposite arm and leg. Repeat 2 times. Complete this exercise 3 times per week.   STRENGTHENING - Abdominals and Quadriceps, Straight Leg Raise   Lie on a firm bed or floor with both legs extended in front of you.  Keeping one leg in contact with the floor, bend the other knee so that your foot can rest flat on the floor.  Find your neutral spine, and tense your abdominal muscles to maintain your spinal position throughout the exercise.  Slowly lift your straight leg off the floor about 6 inches for a count of 3, making sure to not hold your breath.  Still keeping your neutral spine, slowly lower your leg all the way to the floor. Repeat this  exercise with each leg 2 times. Complete this exercise 3 times per week.  POSTURE AND BODY MECHANICS CONSIDERATIONS - Low Back Sprain Keeping correct posture when sitting, standing or completing your activities will reduce the stress put on different body tissues, allowing injured tissues a chance to heal and limiting painful experiences. The following are general guidelines for improved posture.  While reading these guidelines, remember:  The  exercises prescribed by your provider will help you have the flexibility and strength to maintain correct postures.  The correct posture provides the best environment for your joints to work. All of your joints have less wear and tear when properly supported by a spine with good posture. This means you will experience a healthier, less painful body.  Correct posture must be practiced with all of your activities, especially prolonged sitting and standing. Correct posture is as important when doing repetitive low-stress activities (typing) as it is when doing a single heavy-load activity (lifting).  RESTING POSITIONS Consider which positions are most painful for you when choosing a resting position. If you have pain with flexion-based activities (sitting, bending, stooping, squatting), choose a position that allows you to rest in a less flexed posture. You would want to avoid curling into a fetal position on your side. If your pain worsens with extension-based activities (prolonged standing, working overhead), avoid resting in an extended position such as sleeping on your stomach. Most people will find more comfort when they rest with their spine in a more neutral position, neither too rounded nor too arched. Lying on a non-sagging bed on your side with a pillow between your knees, or on your back with a pillow under your knees will often provide some relief. Keep in mind, being in any one position for a prolonged period of time, no matter how correct your posture,  can still lead to stiffness.  PROPER SITTING POSTURE In order to minimize stress and discomfort on your spine, you must sit with correct posture. Sitting with good posture should be effortless for a healthy body. Returning to good posture is a gradual process. Many people can work toward this most comfortably by using various supports until they have the flexibility and strength to maintain this posture on their own. When sitting with proper posture, your ears will fall over your shoulders and your shoulders will fall over your hips. You should use the back of the chair to support your upper back. Your lower back will be in a neutral position, just slightly arched. You may place a small pillow or folded towel at the base of your lower back for  support.  When working at a desk, create an environment that supports good, upright posture. Without extra support, muscles tire, which leads to excessive strain on joints and other tissues. Keep these recommendations in mind:  CHAIR:  A chair should be able to slide under your desk when your back makes contact with the back of the chair. This allows you to work closely.  The chair's height should allow your eyes to be level with the upper part of your monitor and your hands to be slightly lower than your elbows.  BODY POSITION  Your feet should make contact with the floor. If this is not possible, use a foot rest.  Keep your ears over your shoulders. This will reduce stress on your neck and low back.  INCORRECT SITTING POSTURES  If you are feeling tired and unable to assume a healthy sitting posture, do not slouch or slump. This puts excessive strain on your back tissues, causing more damage and pain. Healthier options include:  Using more support, like a lumbar pillow.  Switching tasks to something that requires you to be upright or walking.  Talking a brief walk.  Lying down to rest in a neutral-spine position.  PROLONGED STANDING WHILE  SLIGHTLY LEANING FORWARD  When completing a task that requires you to lean  forward while standing in one place for a long time, place either foot up on a stationary 2-4 inch high object to help maintain the best posture. When both feet are on the ground, the lower back tends to lose its slight inward curve. If this curve flattens (or becomes too large), then the back and your other joints will experience too much stress, tire more quickly, and can cause pain.  CORRECT STANDING POSTURES Proper standing posture should be assumed with all daily activities, even if they only take a few moments, like when brushing your teeth. As in sitting, your ears should fall over your shoulders and your shoulders should fall over your hips. You should keep a slight tension in your abdominal muscles to brace your spine. Your tailbone should point down to the ground, not behind your body, resulting in an over-extended swayback posture.   INCORRECT STANDING POSTURES  Common incorrect standing postures include a forward head, locked knees and/or an excessive swayback. WALKING Walk with an upright posture. Your ears, shoulders and hips should all line-up.  PROLONGED ACTIVITY IN A FLEXED POSITION When completing a task that requires you to bend forward at your waist or lean over a low surface, try to find a way to stabilize 3 out of 4 of your limbs. You can place a hand or elbow on your thigh or rest a knee on the surface you are reaching across. This will provide you more stability, so that your muscles do not tire as quickly. By keeping your knees relaxed, or slightly bent, you will also reduce stress across your lower back. CORRECT LIFTING TECHNIQUES  DO :  Assume a wide stance. This will provide you more stability and the opportunity to get as close as possible to the object which you are lifting.  Tense your abdominals to brace your spine. Bend at the knees and hips. Keeping your back locked in a neutral-spine  position, lift using your leg muscles. Lift with your legs, keeping your back straight.  Test the weight of unknown objects before attempting to lift them.  Try to keep your elbows locked down at your sides in order get the best strength from your shoulders when carrying an object.     Always ask for help when lifting heavy or awkward objects. INCORRECT LIFTING TECHNIQUES DO NOT:   Lock your knees when lifting, even if it is a small object.  Bend and twist. Pivot at your feet or move your feet when needing to change directions.  Assume that you can safely pick up even a paperclip without proper posture.

## 2020-10-02 ENCOUNTER — Other Ambulatory Visit: Payer: Self-pay | Admitting: Family Medicine

## 2020-10-02 DIAGNOSIS — R109 Unspecified abdominal pain: Secondary | ICD-10-CM

## 2021-04-24 ENCOUNTER — Telehealth: Payer: Self-pay

## 2021-04-24 NOTE — Telephone Encounter (Signed)
Could he be scheduled at 4:15 today/video or in the office

## 2021-04-24 NOTE — Telephone Encounter (Signed)
Called left message to call back 

## 2021-04-24 NOTE — Telephone Encounter (Signed)
Called the patient left message to call back 

## 2021-04-24 NOTE — Telephone Encounter (Signed)
Patient Name: Jeremy Peterson Gender: Male DOB: 12-09-1985 Age: 35 Y 1 M 3 D Return Phone Number:  [phone]  Physician Arva Chafe- MD Contact Type Call  Caller Name Leah Himes Relationship To Patient Spouse Return Phone Number [phone] (Primary) Chief Complaint CHEST PAIN - pain, pressure, heaviness or tightness Reason for Call Request to Schedule Office Appointment Initial Comment Caller states that she is calling for her husband. They were all diagnosed last Friday and everyone  is better except for her husband. She would like to  schedule because he is having breathing problems and chest pains. He is very exhausted with these symptoms.  ---Caller states that she is calling for her husband. They were all diagnosed last Friday with COVID and everyone is better except for her husband. Fever on and off

## 2021-04-24 NOTE — Telephone Encounter (Signed)
OK for then or preferably 1230-1235ish today.

## 2021-04-25 NOTE — Telephone Encounter (Signed)
Patient called back, He was made aware that we were trying to get him in for a virtual appointment but he stated he is feeling better now and no longer considers it necessary. He states that this is not the first time covid has taken a long time to go away and he will "tough it out".  He was recommended to call back if his symptoms got worse, or if he ended up changing his mind about the appointment.

## 2021-04-25 NOTE — Telephone Encounter (Signed)
Called left message to call back 

## 2021-05-23 ENCOUNTER — Other Ambulatory Visit: Payer: Self-pay

## 2021-05-23 ENCOUNTER — Emergency Department (HOSPITAL_BASED_OUTPATIENT_CLINIC_OR_DEPARTMENT_OTHER): Payer: 59

## 2021-05-23 ENCOUNTER — Emergency Department (HOSPITAL_BASED_OUTPATIENT_CLINIC_OR_DEPARTMENT_OTHER)
Admission: EM | Admit: 2021-05-23 | Discharge: 2021-05-23 | Disposition: A | Discharge status: Home / Self Care | Payer: 59 | Attending: Emergency Medicine | Admitting: Emergency Medicine

## 2021-05-23 ENCOUNTER — Encounter (HOSPITAL_BASED_OUTPATIENT_CLINIC_OR_DEPARTMENT_OTHER): Payer: Self-pay | Admitting: Emergency Medicine

## 2021-05-23 DIAGNOSIS — M79621 Pain in right upper arm: Secondary | ICD-10-CM | POA: Diagnosis not present

## 2021-05-23 DIAGNOSIS — N5082 Scrotal pain: Secondary | ICD-10-CM | POA: Diagnosis not present

## 2021-05-23 DIAGNOSIS — R519 Headache, unspecified: Secondary | ICD-10-CM | POA: Insufficient documentation

## 2021-05-23 DIAGNOSIS — R6889 Other general symptoms and signs: Secondary | ICD-10-CM

## 2021-05-23 DIAGNOSIS — F606 Avoidant personality disorder: Secondary | ICD-10-CM | POA: Diagnosis not present

## 2021-05-23 DIAGNOSIS — Z87891 Personal history of nicotine dependence: Secondary | ICD-10-CM | POA: Insufficient documentation

## 2021-05-23 DIAGNOSIS — M79622 Pain in left upper arm: Secondary | ICD-10-CM | POA: Diagnosis not present

## 2021-05-23 DIAGNOSIS — N50819 Testicular pain, unspecified: Secondary | ICD-10-CM | POA: Insufficient documentation

## 2021-05-23 DIAGNOSIS — R002 Palpitations: Secondary | ICD-10-CM | POA: Insufficient documentation

## 2021-05-23 DIAGNOSIS — Z8616 Personal history of COVID-19: Secondary | ICD-10-CM | POA: Diagnosis not present

## 2021-05-23 DIAGNOSIS — N23 Unspecified renal colic: Secondary | ICD-10-CM | POA: Diagnosis not present

## 2021-05-23 DIAGNOSIS — R5383 Other fatigue: Secondary | ICD-10-CM | POA: Diagnosis not present

## 2021-05-23 LAB — COMPREHENSIVE METABOLIC PANEL
ALT: 31 U/L (ref 0–44)
AST: 22 U/L (ref 15–41)
Albumin: 4.8 g/dL (ref 3.5–5.0)
Alkaline Phosphatase: 48 U/L (ref 38–126)
Anion gap: 7 (ref 5–15)
BUN: 5 mg/dL — ABNORMAL LOW (ref 6–20)
CO2: 24 mmol/L (ref 22–32)
Calcium: 9.1 mg/dL (ref 8.9–10.3)
Chloride: 106 mmol/L (ref 98–111)
Creatinine, Ser: 0.83 mg/dL (ref 0.61–1.24)
GFR, Estimated: >60 mL/min (ref >60)
Glucose, Bld: 115 mg/dL — ABNORMAL HIGH (ref 70–99)
Potassium: 4.3 mmol/L (ref 3.5–5.1)
Sodium: 137 mmol/L (ref 135–145)
Total Bilirubin: 0.4 mg/dL (ref 0.3–1.2)
Total Protein: 7.8 g/dL (ref 6.5–8.1)

## 2021-05-23 LAB — URINALYSIS, ROUTINE W REFLEX MICROSCOPIC
Bilirubin Urine: NEGATIVE
Glucose, UA: NEGATIVE mg/dL
Hgb urine dipstick: NEGATIVE
Ketones, ur: NEGATIVE mg/dL
Leukocytes,Ua: NEGATIVE
Nitrite: NEGATIVE
Protein, ur: NEGATIVE mg/dL
Specific Gravity, Urine: 1.01 (ref 1.005–1.030)
pH: 8 (ref 5.0–8.0)

## 2021-05-23 LAB — CBC WITH DIFFERENTIAL/PLATELET
Abs Immature Granulocytes: 0.01 10*3/uL (ref 0.00–0.07)
Basophils Absolute: 0 10*3/uL (ref 0.0–0.1)
Basophils Relative: 0 %
Eosinophils Absolute: 0.1 10*3/uL (ref 0.0–0.5)
Eosinophils Relative: 1 %
HCT: 47.8 % (ref 39.0–52.0)
Hemoglobin: 16.9 g/dL (ref 13.0–17.0)
Immature Granulocytes: 0 %
Lymphocytes Relative: 28 %
Lymphs Abs: 1.7 10*3/uL (ref 0.7–4.0)
MCH: 29.7 pg (ref 26.0–34.0)
MCHC: 35.4 g/dL (ref 30.0–36.0)
MCV: 84 fL (ref 80.0–100.0)
Monocytes Absolute: 0.3 10*3/uL (ref 0.1–1.0)
Monocytes Relative: 5 %
Neutro Abs: 4 10*3/uL (ref 1.7–7.7)
Neutrophils Relative %: 66 %
Platelets: 262 10*3/uL (ref 150–400)
RBC: 5.69 MIL/uL (ref 4.22–5.81)
RDW: 12.3 % (ref 11.5–15.5)
WBC: 6.2 10*3/uL (ref 4.0–10.5)
nRBC: 0 % (ref 0.0–0.2)

## 2021-05-23 LAB — D-DIMER, QUANTITATIVE: D-Dimer, Quant: <0.27 ug/mL-FEU (ref 0.00–0.50)

## 2021-05-23 LAB — LIPASE, BLOOD: Lipase: 36 U/L (ref 11–51)

## 2021-05-23 NOTE — Discharge Instructions (Addendum)
I spoke with Dr. Berneice Heinrich of [facility] about the US findings. This presents NO emegency issues and should not lead to any long term problems, however you should follow up with your surgeon (Dr. Mena Goes) about the pain you are having. Please follow up with your primary care doctor about your other complaints. Follow up with cardiology for heart monitoring and palpitaaions. Contact a health care provider if you: Continue to have a fast or irregular heartbeat after 24 hours. Notice that your palpitations occur more often. Get help right away if you: Have chest pain or shortness of breath. Have a severe headache. Feel dizzy or you faint.

## 2021-05-23 NOTE — ED Triage Notes (Signed)
States has ben having lower back pain and RUQ pain. Also has been having heart racing x 6 weeks since having COVID for past 6 weeks

## 2021-05-23 NOTE — ED Provider Notes (Addendum)
[facility] Provider Note   CSN: [id number] Arrival date & time: 05/23/21  1023     History Chief Complaint  Patient presents with   Fatigue    [facility] Peterson is a 35 y.o. male with a hx of anxiety who presents with multiple somatic complaints. Patient states that he had Covid 6 weeks ago and since that time has had a myriad of problems as follows: 1) fatigue 2) racing heart- patient states this awoke him from sleep this morning. He had associated diaphoresis and sob. Self resolved. Hx of palpitions- has seen cardiology but has never worn a heart monitor 3)"weird sweats" - patient works at [employer] [place] on the [place]- he feels that he is sweating more than normal and that particularly he is sweating from his hands and feet 4) axillary pain- he feels tightness in the region of his pectoralis minor/axilla BL. He states it feels like someone is poking a finger into his chest 5) "kidney pain"- described as cramping. BL lumbar region. Worse with lateral flexion especially on the right. Radiates to buttocks. No urinary symptoms. 6) headaches- intermittent- aching- resolve with otc meds. No other neuro complaints. 7) Scrotal pain on the R- hx of vasectomy- feels that his scrotum is retracted on the R and he feels a "small lump" in the scrotal sack. Denies heat numbness or swelling.  HPI     Past Medical History:  Diagnosis Date   Anxiety    Concussion    multiple from college wrestling, no residual   DEPRESSION    History of kidney stones    Migraines    Nephrolithiasis    PAC (premature atrial contraction)    no cardiology no treatment needed last pac few yrs ago    Patient Active Problem List   Diagnosis Date Noted   Palpitations 05/06/2013   DEPRESSION 10/27/2010   FATIGUE 10/27/2010   DIABETES MELLITUS, BORDERLINE 10/27/2010    Past Surgical History:  Procedure Laterality Date   APPENDECTOMY  2011   CHOLECYSTECTOMY  2018   SURGERY  SCROTAL / TESTICULAR  age 46   right   VASECTOMY Bilateral 03/18/2020   Procedure: VASECTOMY;  Surgeon: Festus Aloe, MD;  Location: [facility];  Service: Urology;  Laterality: Bilateral;       Family History  Problem Relation Age of Onset   Heart disease Paternal Grandfather    Hyperlipidemia Father    Hypertension Father    Cancer Maternal Grandfather     Social History   Tobacco Use   Smoking status: Former    Packs/day: 1.00    Years: 5.00    Pack years: 5.00    Types: Cigarettes    Quit date: 09/03/2012    Years since quitting: 8.7   Smokeless tobacco: Former  Scientific laboratory technician Use: Former   Devices: yrs ago quit  Substance Use Topics   Alcohol use: No   Drug use: Not Currently    Types: Benzodiazepines    Comment: CBD for anxiety    Home Medications Prior to Admission medications   Medication Sig Start Date End Date Taking? Authorizing Provider  cyclobenzaprine (FLEXERIL) 10 MG tablet Take 0.5-1 tablets (5-10 mg total) by mouth 3 (three) times daily as needed for muscle spasms. 09/05/20   Shelda Pal, DO  meloxicam (MOBIC) 15 MG tablet Take 1 tablet (15 mg total) by mouth daily as needed for pain. 10/03/20   Shelda Pal, DO  oxycodone (  OXY-IR) 5 MG capsule Take 1 capsule (5 mg total) by mouth every 6 (six) hours as needed for pain. 09/05/20   Shelda Pal, DO    Allergies    Patient has no known allergies.  Review of Systems   Review of Systems Ten systems reviewed and are negative for acute change, except as noted in the HPI.   Physical Exam Updated Vital Signs BP (!) 154/92 (BP Location: Left Arm)   Pulse 66   Temp 97.8 F (36.6 C) (Oral)   Resp 18   Ht $R'5\' 10"'Em$  (1.778 m)   Wt 106.6 kg   SpO2 100%   BMI 33.72 kg/m   Physical Exam Vitals and nursing note reviewed.  Constitutional:      General: He is not in acute distress.    Appearance: He is well-developed. He is not diaphoretic.  HENT:      Head: Normocephalic and atraumatic.  Eyes:     General: No scleral icterus.    Conjunctiva/sclera: Conjunctivae normal.  Cardiovascular:     Rate and Rhythm: Normal rate and regular rhythm.     Heart sounds: Normal heart sounds.  Pulmonary:     Effort: Pulmonary effort is normal. No respiratory distress.     Breath sounds: Normal breath sounds.  Chest:       Comments: TTP BL Pec Minor Sitting in tense position wit forward rounded shoulders Abdominal:     Palpations: Abdomen is soft.     Tenderness: There is no abdominal tenderness. There is no right CVA tenderness or left CVA tenderness.  Musculoskeletal:     Cervical back: Normal range of motion and neck supple.       Back:     Comments: TP R QL region worse with lateral flexion  Skin:    General: Skin is warm and dry.  Neurological:     Mental Status: He is alert.  Psychiatric:        Mood and Affect: Mood is anxious.        Behavior: Behavior normal.    ED Results / Procedures / Treatments   Labs (all labs ordered are listed, but only abnormal results are displayed) Labs Reviewed  COMPREHENSIVE METABOLIC PANEL - Abnormal; Notable for the following components:      Result Value   Glucose, Bld 115 (*)    BUN 5 (*)    All other components within normal limits  LIPASE, BLOOD  URINALYSIS, ROUTINE W REFLEX MICROSCOPIC  CBC WITH DIFFERENTIAL/PLATELET  D-DIMER, QUANTITATIVE    EKG EKG Interpretation  Date/Time:  Tuesday May 23 2021 10:35:54 EDT Ventricular Rate:  103 PR Interval:  146 QRS Duration: 82 QT Interval:  330 QTC Calculation: 432 R Axis:   34 Text Interpretation: Sinus tachycardia Otherwise normal ECG similar to Nov 2020 Confirmed by Sherwood Gambler [phone]) on 05/23/2021 11:07:00 AM  Radiology US SCROTUM W/DOPPLER  Result Date: 05/23/2021 CLINICAL DATA:  RIGHT scrotal pain in a 35 year old male. EXAM: SCROTAL ULTRASOUND DOPPLER ULTRASOUND OF THE TESTICLES TECHNIQUE: Complete ultrasound  examination of the testicles, epididymis, and other scrotal structures was performed. Color and spectral Doppler ultrasound were also utilized to evaluate blood flow to the testicles. COMPARISON:  None FINDINGS: Right testicle Measurements: 3.4 x 1.9 x 3.1 cm. No mass or microlithiasis visualized. Left testicle Measurements: 4.3 x 2.1 x 3.1 cm. No mass or microlithiasis visualized. Right epididymis: Echogenic area adjacent to epididymal head or within the anterior margin of the epididymal head is very well-defined  with leading edge which is well-marginated, this suggest clip in may relate to prior vasectomy but is in an unusual location. No stranding or enlargement about the epididymal head. Left epididymis: Cystic area in the region of the LEFT epididymal head likely moderate epididymal cyst, mildly complex appearing with some thin internal echogenic septi. Hydrocele:  None visualized. Varicocele:  None visualized. Pulsed Doppler interrogation of both testes demonstrates normal low resistance flow seen to the bilateral testes. IMPRESSION: No sonographic evidence of torsion. Normal low resistance flow to the bilateral testes and normal grayscale appearance of the testes. Echogenic area in the region of the RIGHT epididymal head in the area of palpable abnormality appears to represent a clip perhaps from prior vasectomy though is in an unusual location, correlate with possibility of clip migration. Consider urologic consultation in this patient with reported symptoms and palpable abnormality in this area. Moderate-sized epididymal cyst or small spermatocele in the LEFT epididymal head. Electronically Signed   By: Zetta Bills M.D.   On: 05/23/2021 16:26    Procedures Procedures   Medications Ordered in ED Medications - No data to display  ED Course  I have reviewed the triage vital signs and the nursing notes.  Pertinent labs & imaging results that were available during my care of the patient were  reviewed by me and considered in my medical decision making (see chart for details).  Clinical Course as of 05/23/21 1739  Tue May 23, 2021  1654 Case discussed with Dr. Tresa Moore who states that this presents no emergent issues and should have no long-term problems.  Patient may follow-up in the office for evaluation of his testicle pain. [AH]    Clinical Course User Index [AH] Margarita Mail, PA-C   MDM Rules/Calculators/A&P                           Patient here with multiple somatic complaints.  Question if the patient has potential COVID long-haul symptoms. After reviewing all data points the patient does not appear to have a pulmonary embolus, urinary tract infection or kidney stone.  I reviewed patient's labs including CBC, CMP, urine, D-dimer and lipase all without significant abnormality.  I also reviewed the patient's scrotal ultrasound which showed an abnormally positioned clip which may represent migration.  This was discussed with Dr. Tresa Moore as documented in the clinical course.  I ordered an EKG which shows sinus tachycardia. Do not think that the patient's complaints represent emergent issue.  Patient has been reasonably's clearing and may follow-up in the outpatient setting with his primary care doctor, urology, and with cardiology for potential cardiac monitoring.  Patient appears otherwise appropriate for discharge and return precautions given. Final Clinical Impression(s) / ED Diagnoses Final diagnoses:  Multiple somatic complaints  Persistent pain in testicle  Palpitations    Rx / DC Orders ED Discharge Orders     None        Margarita Mail, PA-C 05/23/21 1737    Margarita Mail, PA-C 05/23/21 1739    Drenda Freeze, MD 05/25/21 [phone]

## 2021-05-26 ENCOUNTER — Other Ambulatory Visit: Payer: Self-pay

## 2021-05-26 ENCOUNTER — Encounter: Payer: Self-pay | Admitting: Family Medicine

## 2021-05-26 ENCOUNTER — Ambulatory Visit (INDEPENDENT_AMBULATORY_CARE_PROVIDER_SITE_OTHER): Payer: 59 | Admitting: Family Medicine

## 2021-05-26 VITALS — BP 138/86 | HR 86 | Temp 98.6°F | Ht 70.0 in | Wt 222.2 lb

## 2021-05-26 DIAGNOSIS — G8929 Other chronic pain: Secondary | ICD-10-CM | POA: Diagnosis not present

## 2021-05-26 DIAGNOSIS — R634 Abnormal weight loss: Secondary | ICD-10-CM | POA: Diagnosis not present

## 2021-05-26 DIAGNOSIS — M2550 Pain in unspecified joint: Secondary | ICD-10-CM

## 2021-05-26 DIAGNOSIS — U099 Post covid-19 condition, unspecified: Secondary | ICD-10-CM | POA: Diagnosis not present

## 2021-05-26 LAB — VITAMIN D 25 HYDROXY (VIT D DEFICIENCY, FRACTURES): Vit D, 25-Hydroxy: 46 ng/mL (ref 30–100)

## 2021-05-26 LAB — T4, FREE: Free T4: 1.3 ng/dL (ref 0.8–1.8)

## 2021-05-26 LAB — TSH: TSH: 1.2 mIU/L (ref 0.40–4.50)

## 2021-05-26 MED ORDER — CELECOXIB 100 MG PO CAPS: 100.0000 mg | ORAL_CAPSULE | Freq: Two times a day (BID) | ORAL | 2 refills | Status: DC

## 2021-05-26 NOTE — Progress Notes (Signed)
Chief Complaint  Patient presents with   on and off covid symptoms    Going on for over 2 months. Becoming emotional over just not getting well and feeling poorly so much of the time.    Subjective: Patient is a 35 y.o. male here for lingering covid symptoms.  Over the past 2 mo, he has been having ranging HR's, aching joints, diarrhea, shortness of breath with exertion, and muscles, headaches, mental fog, can't get warm. It is starting to affect his mental health. It is not getting better. He has tried toughing it out, but it is not better despite time. Tylenol was not helpful. Ibuprofen started hurting his stomach. He has not taken any other medication.  Denies coughing, sore throat, loss of taste/smell, bleeding.   Past Medical History:  Diagnosis Date   Anxiety    Concussion    multiple from college wrestling, no residual   DEPRESSION    History of kidney stones    Migraines    Nephrolithiasis    PAC (premature atrial contraction)    no cardiology no treatment needed last pac few yrs ago    Objective: BP 138/86   Pulse 86   Temp 98.6 F (37 C) (Oral)   Ht 5\' 10"  (1.778 m)   Wt 222 lb 4 oz (100.8 kg)   SpO2 99%   BMI 31.89 kg/m  General: Awake, appears stated age HEENT: MMM, EOMi Heart: RRR, no murmurs, no lower extremity edema Lungs: CTAB, no rales, wheezes or rhonchi. 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  Refer to pulmonology to a physician who specializes in long COVID type symptoms.  He is having joint pain, dyspnea on exertion, headaches, mental fog, diarrhea that is lingering.  We will check above labs as well.  Gave  counseling information as this is serving as a stressor for him.  Stay hydrated.  Trial Celebrex as this may be easier on his stomach and help with his pain/chills. The patient voiced understanding and agreement to the plan.  [place], DO 05/26/21  3:10 PM

## 2021-05-26 NOTE — Patient Instructions (Addendum)
Give [place] 2-3 business days to get the results of your labs back.   Keep the diet clean and stay active.  If you do not hear anything about your referral in the next 1-2 weeks, call our office and ask for an update.  Start using Metamucil or Benefiber daily.   Please consider counseling. Contact [phone] to schedule an appointment or inquire about cost/insurance coverage.  Let us know if you need anything.

## 2021-06-07 ENCOUNTER — Institutional Professional Consult (permissible substitution): Payer: 59 | Admitting: Pulmonary Disease

## 2021-06-13 NOTE — Progress Notes (Signed)
Cardiology Office Note:    Date:  06/13/2021   ID:  [facility], DOB Oct 03, 1985, MRN [id number]  PCP:  Sharlene Dory, DO   [facility] Providers Cardiologist:  None     Referring MD: Sharlene Dory*   No chief complaint on file. Palpitations  History of Present Illness:    Jeremy Peterson is a 35 y.o. male with a hx of long COVID, former smoker quit 2014.  He has noted persistent symptoms since COVID (has had 3 infections) with palpations, joint aches, diarrhea, SOB on exertion, muscle aches, and brain fog. He has low mood. Cardiology was consulted for palpitations. EKG in September showed sinus tachycardia. He gets left sided chest cramping. He has dizziness. He notes facial numbness. Notes that he is always cold now. TSH is normal.He denies persistent angina, no significant dyspnea on exertion. Night sweats. Relatively pan positive for ROS. He feels deconditioned.  No CVD history. He has noted occasionally fluttering. He has persistent symptoms. He was dizzy and heart rate in the 50s. Occurs with urination has felt LH and tinnitus. No syncopal episodes.   Family History: No family hx of arrhythmia. Sister with valvular insufficiency. Family hx of aneurysm , now deceased in there 30s. Uncles with Mis in there 50s.  Surgical History: Appendectomy. Non discended testicle repair and vesicotomy   Past Medical History:  Diagnosis Date   Anxiety    Concussion    multiple from college wrestling, no residual   DEPRESSION    History of kidney stones    Migraines    Nephrolithiasis    PAC (premature atrial contraction)    no cardiology no treatment needed last pac few yrs ago    Past Surgical History:  Procedure Laterality Date   APPENDECTOMY  2011   CHOLECYSTECTOMY  2018   SURGERY SCROTAL / TESTICULAR  age 52   right   VASECTOMY Bilateral 03/18/2020   Procedure: VASECTOMY;  Surgeon: Jerilee Field, MD;  Location: [facility];  Service:  Urology;  Laterality: Bilateral;    Current Medications: No outpatient medications have been marked as taking for the 06/14/21 encounter (Appointment) with Maisie Fus, MD.     Allergies:   Patient has no known allergies.   Social History   Socioeconomic History   Marital status: Married    Spouse name: Not on file   Number of children: 1   Years of education: Not on file   Highest education level: Not on file  Occupational History   Not on file  Tobacco Use   Smoking status: Former    Packs/day: 1.00    Years: 5.00    Pack years: 5.00    Types: Cigarettes    Quit date: 09/03/2012    Years since quitting: 8.7   Smokeless tobacco: Former  Building services engineer Use: Former   Devices: yrs ago quit  Substance and Sexual Activity   Alcohol use: No   Drug use: Not Currently    Types: Benzodiazepines    Comment: CBD for anxiety   Sexual activity: Not on file  Other Topics Concern   Not on file  Social History Narrative   Not on file   Social Determinants of Health   Financial Resource Strain: Not on file  Food Insecurity: Not on file  Transportation Needs: Not on file  Physical Activity: Not on file  Stress: Not on file  Social Connections: Not on file     Family  History: The patient's family history includes Cancer in his maternal grandfather; Heart disease in his paternal grandfather; Hyperlipidemia in his father; Hypertension in his father.  ROS:   Please see the history of present illness.    All other systems reviewed and are negative.  EKGs/Labs/Other Studies Reviewed:    The following studies were reviewed today:  EKG:  EKG is  ordered today.  The ekg ordered today demonstrates   06/14/2021- NSR, arrhythmia  05/23/2021:Sinus tachycardia, no significant ST-T changes  07/16/2019 - NSR  Recent Labs: 05/23/2021: ALT 31; BUN 5; Creatinine, Ser 0.83; Hemoglobin 16.9; Platelets 262; Potassium 4.3; Sodium 137 05/26/2021: TSH 1.20   Recent Lipid Panel No  results found for: CHOL, TRIG, HDL, CHOLHDL, VLDL, LDLCALC, LDLDIRECT   Risk Assessment/Calculations:     The ASCVD Risk score (Arnett DK, et al., 2019) failed to calculate for the following reasons:   The 2019 ASCVD risk score is only valid for ages 29 to 25      Physical Exam:    VS:  There were no vitals taken for this visit.    Wt Readings from Last 3 Encounters:  05/26/21 222 lb 4 oz (100.8 kg)  05/23/21 235 lb (106.6 kg)  09/05/20 255 lb (115.7 kg)    GEN:  Well nourished, well developed in no acute distress HEENT: Normal NECK: No JVD; No carotid bruits VASC: 2+ radial pulses BL CARDIAC: RRR, no murmurs, rubs, gallops RESPIRATORY:  Clear to auscultation without rales, wheezing or rhonchi  ABDOMEN: Soft, non-tender, non-distended MUSCULOSKELETAL:  No edema; No deformity  SKIN: Warm and dry NEUROLOGIC:  Alert and oriented x 3 PSYCHIATRIC:  Low mood, melancholy  ASSESSMENT:    #Palpitations: He had sinus tachycardia in September. He reports frequent palpitations. With long covid some patients have had inappropriate sinus tachycardia. He has not had any syncopal episodes, have low suspicion of a concerning tachycarrhythmia. Will order a heart monitor today for 14 days. It will be mailed to him. If he has persistent tachycardia will plan to start low dose propanolol. He can use my chart to communicate these next steps.  PLAN:    In order of problems listed above:  14 day Ziopatch     Medication Adjustments/Labs and Tests Ordered: Current medicines are reviewed at length with the patient today.  Concerns regarding medicines are outlined above.    Signed, Maisie Fus, MD  06/13/2021 12:25 PM    [facility]

## 2021-06-14 ENCOUNTER — Encounter: Payer: Self-pay | Admitting: Internal Medicine

## 2021-06-14 ENCOUNTER — Ambulatory Visit (INDEPENDENT_AMBULATORY_CARE_PROVIDER_SITE_OTHER): Payer: 59 | Admitting: Internal Medicine

## 2021-06-14 ENCOUNTER — Ambulatory Visit: Payer: 59

## 2021-06-14 ENCOUNTER — Other Ambulatory Visit: Payer: Self-pay

## 2021-06-14 VITALS — BP 135/78 | HR 69 | Ht 70.0 in | Wt 226.8 lb

## 2021-06-14 DIAGNOSIS — R002 Palpitations: Secondary | ICD-10-CM

## 2021-06-14 NOTE — Patient Instructions (Addendum)
Medication Instructions:  Your physician recommends that you continue on your current medications as directed. Please refer to the Current Medication list given to you today.  *If you need a refill on your cardiac medications before your next appointment, please call your pharmacy*   Lab Work: None If you have labs (blood work) drawn today and your tests are completely normal, you will receive your results only by: MyChart Message (if you have MyChart) OR A paper copy in the mail If you have any lab test that is abnormal or we need to change your treatment, we will call you to review the results.   Testing/Procedures: A zio monitor was ordered today. It will remain on for 14 days. You will then return monitor and event diary in provided box. It takes 1-2 weeks for report to be downloaded and returned to [place]. We will call you with the results. If monitor falls off or has orange flashing light, please call Zio for further instructions.   ZIO  WHY IS MY DOCTOR PRESCRIBING ZIO? The Zio system is proven and trusted by physicians to detect and diagnose irregular heart rhythms -- and has been prescribed to hundreds of thousands of patients.  The FDA has cleared the Zio system to monitor for many different kinds of irregular heart rhythms. In a study, physicians were able to reach a diagnosis 90% of the time with the Zio system1.  You can wear the Zio monitor -- a small, discreet, comfortable patch -- during your normal day-to-day activity, including while you sleep, shower, and exercise, while it records every single heartbeat for analysis.  1Barrett, P., et al. Comparison of 24 Hour Holter Monitoring Versus 14 Day Novel Adhesive Patch Electrocardiographic Monitoring. American Journal of Medicine, 2014.  ZIO VS. HOLTER MONITORING The Zio monitor can be comfortably worn for up to 14 days. Holter monitors can be worn for 24 to 48 hours, limiting the time to record any irregular heart rhythms you  may have. Zio is able to capture data for the 51% of patients who have their first symptom-triggered arrhythmia after 48 hours.1  LIVE WITHOUT RESTRICTIONS The Zio ambulatory cardiac monitor is a small, unobtrusive, and water-resistant patch--you might even forget you're wearing it. The Zio monitor records and stores every beat of your heart, whether you're sleeping, working out, or showering. Remove 14 days after application.      Follow-Up: At [facility], you and your health needs are our priority.  As part of our continuing mission to provide you with exceptional heart care, we have created designated Provider Care Teams.  These Care Teams include your primary Cardiologist (physician) and Advanced Practice Providers (APPs -  Physician Assistants and Nurse Practitioners) who all work together to provide you with the care you need, when you need it.  We recommend signing up for the patient portal called "MyChart".  Sign up information is provided on this After Visit Summary.  MyChart is used to connect with patients for Virtual Visits (Telemedicine).  Patients are able to view lab/test results, encounter notes, upcoming appointments, etc.  Non-urgent messages can be sent to your provider as well.   To learn more about what you can do with MyChart, go to [email or web address].    Your next appointment:   6 month(s)  The format for your next appointment:   In Person  Provider:   Dr. Carolan Clines   Other Instructions

## 2021-06-14 NOTE — Progress Notes (Unsigned)
Enrolled patient for a 14 day Zio XT  monitor to be mailed to patients home  °

## 2022-02-27 ENCOUNTER — Telehealth: Payer: Self-pay | Admitting: Family Medicine

## 2022-02-28 ENCOUNTER — Ambulatory Visit (INDEPENDENT_AMBULATORY_CARE_PROVIDER_SITE_OTHER): Payer: 59 | Admitting: Family Medicine

## 2022-02-28 ENCOUNTER — Encounter: Payer: Self-pay | Admitting: Family Medicine

## 2022-02-28 ENCOUNTER — Ambulatory Visit (HOSPITAL_BASED_OUTPATIENT_CLINIC_OR_DEPARTMENT_OTHER)
Admission: RE | Admit: 2022-02-28 | Discharge: 2022-02-28 | Disposition: A | Discharge status: Home / Self Care | Payer: 59 | Source: Ambulatory Visit | Attending: Family Medicine | Admitting: Family Medicine

## 2022-02-28 VITALS — BP 136/85 | HR 80 | Temp 98.5°F | Ht 70.0 in | Wt 210.0 lb

## 2022-02-28 DIAGNOSIS — G4452 New daily persistent headache (NDPH): Secondary | ICD-10-CM | POA: Diagnosis not present

## 2022-02-28 DIAGNOSIS — R110 Nausea: Secondary | ICD-10-CM | POA: Insufficient documentation

## 2022-02-28 DIAGNOSIS — R5383 Other fatigue: Secondary | ICD-10-CM | POA: Insufficient documentation

## 2022-02-28 DIAGNOSIS — R52 Pain, unspecified: Secondary | ICD-10-CM

## 2022-02-28 DIAGNOSIS — E559 Vitamin D deficiency, unspecified: Secondary | ICD-10-CM

## 2022-02-28 DIAGNOSIS — R1084 Generalized abdominal pain: Secondary | ICD-10-CM | POA: Insufficient documentation

## 2022-02-28 DIAGNOSIS — M542 Cervicalgia: Secondary | ICD-10-CM

## 2022-02-28 NOTE — Progress Notes (Signed)
Bilateral neck pain behind ears Ear popping when moving head Tremor Nauseated  Headache  Excessive sweating in hands in feet

## 2022-02-28 NOTE — Progress Notes (Signed)
Acute Office Visit  Subjective:     Patient ID: Jeremy Peterson, male    DOB: 1985/09/23, 36 y.o.   MRN: [id number]  CC: headache, abdominal pain    HPI Patient is in today for headache, abdominal pain, etc.   COVID + 2021 and 2022 Back in October thought long-haul covid, started going to specialists; he was working a lot at the time and unable to make all the appointments Symptoms did gradually improve until about a month ago and new things started popping up.  Constant symptoms lately (the past month or two) include tremor, headache and burning/tightness/pain in back of neck and around ear, nausea, abdominal pain, tightness in neck/ears bilaterally worse with stretching, chest pain around nipples, excessive sweating in hands/feet (may be anxiety related to symptoms)  Headache: - constant x1 month - occipatal, but sometimes radiates across top of head - baseline = dull 2/10, but can flare up to 5/10 - persistence is worrying him - worse with: no triggers - better with: temporary improvement with ibuprofen/CBD, but never fully relieved  - sensitivity to lights (wears sunglasses often) - had some facial twitching on left side a few nights ago, otherwise no asymmetry, slurred speech, or vision changes - when headache is really bad it feels like it takes extra effort to speak, play with kids, etc.  - often feels "swimmy headed" like something is off    Bilateral ears/neck discomfort: - Tightness, pain (never debilitating, has been trying to push through); temporary improvement with NSAIDs, ice - Cracking/popping with head movements - Ringing in ears at night, occasional dulled hearing for a few seconds    Shaking/tremor:  - doesn't think it's anxiety related because even when not feeling anxious he has a constant shake - interfering with fine motor skill - never goes away - no gait changes/instability   Abdominal/flank pain: - Worse on left, can feel a "lumping" under left  ribs  - sharp, worse with palpation, especially around "lumping" area - feels like a cramp sometimes - very pinpoint to left lower ribs/flank area - always feels mildly nauseous like a "sour stomach" - no diet triggers, but has to eat smaller portions - this week he feels like he is having a hard time eating and expects to loose 5-10 pounds, but then other weeks he eats fine  - no vomiting, fevers - taking antiacids occasionally - tends to help with the sour-gut feeling - some diarrhea and constipation at times, lots of chills, generalized body aches  - has been dealing with bilateral/superficial upper abdomen and chest discomfort, feels like inflammation, denies any cardiac chest pain, dull pressure, dyspnea, radiation into arm/jaw; "feels like there is rampant inflammation all over my body"  Biggest concerns are these new symptoms listed above over the past month   Has already had gallbaldder removed; admits to sometimes eating trigger foods, has had some occasional upper abd/epigastric discomfort.     ROS All review of systems negative except what is listed in the HPI      Objective:    BP 136/85   Pulse 80   Temp 98.5 F (36.9 C)   Ht 5\' 10"  (1.778 m)   Wt 210 lb (95.3 kg)   SpO2 100%   BMI 30.13 kg/m    Physical Exam Vitals reviewed.  Constitutional:      General: He is not in acute distress.    Appearance: Normal appearance. He is not ill-appearing.  Cardiovascular:  Rate and Rhythm: Normal rate and regular rhythm.     Pulses: Normal pulses.     Heart sounds: Normal heart sounds.  Pulmonary:     Effort: Pulmonary effort is normal.     Breath sounds: Normal breath sounds.  Abdominal:     General: Abdomen is flat. Bowel sounds are normal. There is no distension.     Palpations: Abdomen is soft.     Tenderness: There is no right CVA tenderness, left CVA tenderness, guarding or rebound.     Comments: LUQ, more lateral, with tenderness and palpable  inflammation, nodules, no skin changes  Musculoskeletal:        General: Normal range of motion.     Cervical back: Normal range of motion and neck supple. Tenderness present. No rigidity.  Lymphadenopathy:     Cervical: No cervical adenopathy.  Skin:    General: Skin is warm and dry.  Neurological:     Mental Status: He is alert and oriented to person, place, and time.     Cranial Nerves: No cranial nerve deficit.     Sensory: No sensory deficit.     Motor: No weakness.     Coordination: Coordination normal.     Gait: Gait normal.     Comments: Fine tremor to bilateral hands  Psychiatric:        Mood and Affect: Mood normal.        Behavior: Behavior normal.        Thought Content: Thought content normal.        Judgment: Judgment normal.        No results found for any visits on 02/28/22.      Assessment & Plan:   Problem List Items Addressed This Visit   None Visit Diagnoses     New daily persistent headache    -  Primary   Relevant Orders   Ambulatory referral to Neurology   CBC   Comprehensive metabolic panel   Lipid panel   TSH   Vitamin B12   Sedimentation rate   VITAMIN D 25 Hydroxy (Vit-D Deficiency, Fractures)   Lipase   Amylase   Generalized abdominal pain       Relevant Orders   CBC   Comprehensive metabolic panel   Lipid panel   TSH   Vitamin B12   Sedimentation rate   Lipase   Amylase   US Abdomen Complete (Completed)   Nausea       Relevant Orders   CBC   Comprehensive metabolic panel   Lipid panel   TSH   Vitamin B12   Sedimentation rate   US Abdomen Complete (Completed)   Fatigue, unspecified type       Relevant Orders   Ambulatory referral to Neurology   CBC   Comprehensive metabolic panel   Lipid panel   TSH   Vitamin B12   Sedimentation rate   US Abdomen Complete (Completed)   Generalized body aches       Relevant Orders   Ambulatory referral to Neurology   CBC   Comprehensive metabolic panel   Lipid panel   TSH    Vitamin B12   Sedimentation rate   Neck pain       Relevant Orders   Ambulatory referral to Neurology   CBC   Comprehensive metabolic panel   Lipid panel   TSH   Vitamin B12   Sedimentation rate   Vitamin D deficiency       Relevant  Orders   VITAMIN D 25 Hydroxy (Vit-D Deficiency, Fractures)      Uncertain etiology for the above symptoms, but patient is quite concerned by the persistence, severity. Starting with labs, abdominal US (especially interested in palpable abnormality to left abdomen). Referral to Neuro for the new daily/persistent headaches and neck pain - no red flags on neuro exam in office; consider imaging, defer to PCP or neuro consult. Patient aware of signs/symptoms requiring further/urgent evaluation. Recommend close follow-up with PCP to continue workup/eval. Patient pleasant and agreeable to plan.      No orders of the defined types were placed in this encounter.   Return in about 2 weeks (around 03/14/2022) for PCP f/u 2 weeks .  Clayborne Dana, NP

## 2022-02-28 NOTE — Patient Instructions (Signed)
Starting labs.  Abdominal US Neuro referral for daily/persistent headaches/neck pain Will update you with results and plan.  Close follow-up with PCP to continue workup pending these initial results.

## 2022-03-01 LAB — AMYLASE: Amylase: 56 U/L (ref 27–131)

## 2022-03-01 LAB — COMPREHENSIVE METABOLIC PANEL
ALT: 29 U/L (ref 0–53)
AST: 19 U/L (ref 0–37)
Albumin: 5.1 g/dL (ref 3.5–5.2)
Alkaline Phosphatase: 50 U/L (ref 39–117)
BUN: 10 mg/dL (ref 6–23)
CO2: 29 mEq/L (ref 19–32)
Calcium: 10 mg/dL (ref 8.4–10.5)
Chloride: 101 mEq/L (ref 96–112)
Creatinine, Ser: 0.99 mg/dL (ref 0.40–1.50)
GFR: 97.7 mL/min (ref >60.00)
Glucose, Bld: 105 mg/dL — ABNORMAL HIGH (ref 70–99)
Potassium: 4.7 mEq/L (ref 3.5–5.1)
Sodium: 137 mEq/L (ref 135–145)
Total Bilirubin: 0.6 mg/dL (ref 0.2–1.2)
Total Protein: 7.6 g/dL (ref 6.0–8.3)

## 2022-03-01 LAB — CBC
HCT: 49 % (ref 39.0–52.0)
Hemoglobin: 16.9 g/dL (ref 13.0–17.0)
MCHC: 34.5 g/dL (ref 30.0–36.0)
MCV: 85.9 fl (ref 78.0–100.0)
Platelets: 244 10*3/uL (ref 150.0–400.0)
RBC: 5.7 Mil/uL (ref 4.22–5.81)
RDW: 13.2 % (ref 11.5–15.5)
WBC: 6.4 10*3/uL (ref 4.0–10.5)

## 2022-03-01 LAB — LIPID PANEL
Cholesterol: 219 mg/dL — ABNORMAL HIGH (ref 0–200)
HDL: 53.7 mg/dL (ref >39.00)
LDL Cholesterol: 149 mg/dL — ABNORMAL HIGH (ref 0–99)
NonHDL: 165.01
Total CHOL/HDL Ratio: 4
Triglycerides: 80 mg/dL (ref 0.0–149.0)
VLDL: 16 mg/dL (ref 0.0–40.0)

## 2022-03-01 LAB — TSH: TSH: 1.24 u[IU]/mL (ref 0.35–5.50)

## 2022-03-01 LAB — VITAMIN D 25 HYDROXY (VIT D DEFICIENCY, FRACTURES): VITD: 44.38 ng/mL (ref 30.00–100.00)

## 2022-03-01 LAB — LIPASE: Lipase: 28 U/L (ref 11.0–59.0)

## 2022-03-01 LAB — VITAMIN B12: Vitamin B-12: 1090 pg/mL — ABNORMAL HIGH (ref 211–911)

## 2022-03-01 LAB — SEDIMENTATION RATE: Sed Rate: 1 mm/hr (ref 0–15)

## 2022-03-14 ENCOUNTER — Ambulatory Visit (INDEPENDENT_AMBULATORY_CARE_PROVIDER_SITE_OTHER): Payer: 59 | Admitting: Family Medicine

## 2022-03-14 ENCOUNTER — Encounter: Payer: Self-pay | Admitting: Family Medicine

## 2022-03-14 VITALS — BP 120/80 | HR 63 | Temp 98.0°F | Ht 70.0 in | Wt 215.2 lb

## 2022-03-14 DIAGNOSIS — F411 Generalized anxiety disorder: Secondary | ICD-10-CM | POA: Diagnosis not present

## 2022-03-14 DIAGNOSIS — R519 Headache, unspecified: Secondary | ICD-10-CM

## 2022-03-14 MED ORDER — VENLAFAXINE HCL ER 37.5 MG PO CP24: 37.5000 mg | ORAL_CAPSULE | Freq: Every day | ORAL | 2 refills | Status: DC

## 2022-03-14 NOTE — Patient Instructions (Addendum)
Sleep Hygiene Tips: Do not watch TV or look at screens within 1 hour of going to bed. If you do, make sure there is a blue light filter (nighttime mode) involved. Try to go to bed around the same time every night. Wake up at the same time within 1 hour of regular time. Ex: If you wake up at 7 AM for work, do not sleep past 8 AM on days that you don't work. Do not drink alcohol before bedtime. Do not consume caffeine-containing beverages after noon or within 9 hours of intended bedtime. Get regular exercise/physical activity in your life, but not within 2 hours of planned bedtime. Do not take naps.  Do not eat within 2 hours of planned bedtime. Melatonin, 3-5 mg 30-60 minutes before planned bedtime may be helpful.  The bed should be for sleep or sex only. If after 20-30 minutes you are unable to fall asleep, get up and do something relaxing. Do this until you feel ready to go to sleep again.   Please consider counseling. Contact [phone] to schedule an appointment or inquire about cost/insurance coverage.  [facility] located at [address], [place], [place].  Phone number = [phone].  Dr. Regan Lemming - Adult Psychiatry.    [facility] located at [address] [place], [place], [place]. Phone number = [phone].   [facility] located at [address] [place], [place].  Phone number = [phone].   [facility] located at [address] [place], [place], [place].  Phone number = [phone].  Coping skills Choose 5 that work for you: Take a deep breath Count to 20 Read a book Do a puzzle Meditate Bake Sing Knit Garden Pray Go outside Call a friend Listen to music Take a walk Color Send a note Take a bath Watch a movie Be alone in a quiet place Pet an animal Visit a friend Journal Exercise Stretch   Let us know if you need anything.  EXERCISES RANGE OF MOTION (ROM) AND STRETCHING  EXERCISES  These exercises may help you when beginning to rehabilitate your issue. In order to successfully resolve your symptoms, you must improve your posture. These exercises are designed to help reduce the forward-head and rounded-shoulder posture which contributes to this condition. Your symptoms may resolve with or without further involvement from your physician, physical therapist or athletic trainer. While completing these exercises, remember:  Restoring tissue flexibility helps normal motion to return to the joints. This allows healthier, less painful movement and activity. An effective stretch should be held for at least 20 seconds, although you may need to begin with shorter hold times for comfort. A stretch should never be painful. You should only feel a gentle lengthening or release in the stretched tissue. Do not do any stretch or exercise that you cannot tolerate.  STRETCH- Axial Extensors Lie on your back on the floor. You may bend your knees for comfort. Place a rolled-up hand towel or dish towel, about 2 inches in diameter, under the part of your head that makes contact with the floor. Gently tuck your chin, as if trying to make a "double chin," until you feel a gentle stretch at the base of your head. Hold 15-20 seconds. Repeat 2-3 times. Complete this exercise 1 time per day.   STRETCH - Axial Extension  Stand or sit on a firm surface. Assume a good posture: chest up, shoulders drawn back, abdominal muscles slightly tense, knees unlocked (if standing) and  feet hip width apart. Slowly retract your chin so your head slides back and your chin slightly lowers. Continue to look straight ahead. You should feel a gentle stretch in the back of your head. Be certain not to feel an aggressive stretch since this can cause headaches later. Hold for 15-20 seconds. Repeat 2-3 times. Complete this exercise 1 time per day.  STRETCH - Cervical Side Bend  Stand or sit on a firm surface. Assume  a good posture: chest up, shoulders drawn back, abdominal muscles slightly tense, knees unlocked (if standing) and feet hip width apart. Without letting your nose or shoulders move, slowly tip your right / left ear to your shoulder until your feel a gentle stretch in the muscles on the opposite side of your neck. Hold 15-20 seconds. Repeat 2-3 times. Complete this exercise 1-2 times per day.  STRETCH - Cervical Rotators  Stand or sit on a firm surface. Assume a good posture: chest up, shoulders drawn back, abdominal muscles slightly tense, knees unlocked (if standing) and feet hip width apart. Keeping your eyes level with the ground, slowly turn your head until you feel a gentle stretch along the back and opposite side of your neck. Hold 15-20 seconds. Repeat 2-3 times. Complete this exercise 1-2 times per day.  RANGE OF MOTION - Neck Circles  Stand or sit on a firm surface. Assume a good posture: chest up, shoulders drawn back, abdominal muscles slightly tense, knees unlocked (if standing) and feet hip width apart. Gently roll your head down and around from the back of one shoulder to the back of the other. The motion should never be forced or painful. Repeat the motion 10-20 times, or until you feel the neck muscles relax and loosen. Repeat 2-3 times. Complete the exercise 1-2 times per day. STRENGTHENING EXERCISES - Cervical Strain and Sprain These exercises may help you when beginning to rehabilitate your injury. They may resolve your symptoms with or without further involvement from your physician, physical therapist, or athletic trainer. While completing these exercises, remember:  Muscles can gain both the endurance and the strength needed for everyday activities through controlled exercises. Complete these exercises as instructed by your physician, physical therapist, or athletic trainer. Progress the resistance and repetitions only as guided. You may experience muscle soreness or  fatigue, but the pain or discomfort you are trying to eliminate should never worsen during these exercises. If this pain does worsen, stop and make certain you are following the directions exactly. If the pain is still present after adjustments, discontinue the exercise until you can discuss the trouble with your clinician.  STRENGTH - Cervical Flexors, Isometric Face a wall, standing about 6 inches away. Place a small pillow, a ball about 6-8 inches in diameter, or a folded towel between your forehead and the wall. Slightly tuck your chin and gently push your forehead into the soft object. Push only with mild to moderate intensity, building up tension gradually. Keep your jaw and forehead relaxed. Hold 10 to 20 seconds. Keep your breathing relaxed. Release the tension slowly. Relax your neck muscles completely before you start the next repetition. Repeat 2-3 times. Complete this exercise 1 time per day.  STRENGTH- Cervical Lateral Flexors, Isometric  Stand about 6 inches away from a wall. Place a small pillow, a ball about 6-8 inches in diameter, or a folded towel between the side of your head and the wall. Slightly tuck your chin and gently tilt your head into the soft object. Push only  with mild to moderate intensity, building up tension gradually. Keep your jaw and forehead relaxed. Hold 10 to 20 seconds. Keep your breathing relaxed. Release the tension slowly. Relax your neck muscles completely before you start the next repetition. Repeat 2-3 times. Complete this exercise 1 time per day.  STRENGTH - Cervical Extensors, Isometric  Stand about 6 inches away from a wall. Place a small pillow, a ball about 6-8 inches in diameter, or a folded towel between the back of your head and the wall. Slightly tuck your chin and gently tilt your head back into the soft object. Push only with mild to moderate intensity, building up tension gradually. Keep your jaw and forehead relaxed. Hold 10 to 20 seconds.  Keep your breathing relaxed. Release the tension slowly. Relax your neck muscles completely before you start the next repetition. Repeat 2-3 times. Complete this exercise 1 time per day.  POSTURE AND BODY MECHANICS CONSIDERATIONS Keeping correct posture when sitting, standing or completing your activities will reduce the stress put on different body tissues, allowing injured tissues a chance to heal and limiting painful experiences. The following are general guidelines for improved posture. Your physician or physical therapist will provide you with any instructions specific to your needs. While reading these guidelines, remember: The exercises prescribed by your provider will help you have the flexibility and strength to maintain correct postures. The correct posture provides the optimal environment for your joints to work. All of your joints have less wear and tear when properly supported by a spine with good posture. This means you will experience a healthier, less painful body. Correct posture must be practiced with all of your activities, especially prolonged sitting and standing. Correct posture is as important when doing repetitive low-stress activities (typing) as it is when doing a single heavy-load activity (lifting).  PROLONGED STANDING WHILE SLIGHTLY LEANING FORWARD When completing a task that requires you to lean forward while standing in one place for a long time, place either foot up on a stationary 2- to 4-inch high object to help maintain the best posture. When both feet are on the ground, the low back tends to lose its slight inward curve. If this curve flattens (or becomes too large), then the back and your other joints will experience too much stress, fatigue more quickly, and can cause pain.   RESTING POSITIONS Consider which positions are most painful for you when choosing a resting position. If you have pain with flexion-based activities (sitting, bending, stooping, squatting),  choose a position that allows you to rest in a less flexed posture. You would want to avoid curling into a fetal position on your side. If your pain worsens with extension-based activities (prolonged standing, working overhead), avoid resting in an extended position such as sleeping on your stomach. Most people will find more comfort when they rest with their spine in a more neutral position, neither too rounded nor too arched. Lying on a non-sagging bed on your side with a pillow between your knees, or on your back with a pillow under your knees will often provide some relief. Keep in mind, being in any one position for a prolonged period of time, no matter how correct your posture, can still lead to stiffness.  WALKING Walk with an upright posture. Your ears, shoulders, and hips should all line up. OFFICE WORK When working at a desk, create an environment that supports good, upright posture. Without extra support, muscles fatigue and lead to excessive strain on joints and other  tissues.  CHAIR: A chair should be able to slide under your desk when your back makes contact with the back of the chair. This allows you to work closely. The chair's height should allow your eyes to be level with the upper part of your monitor and your hands to be slightly lower than your elbows. Body position: Your feet should make contact with the floor. If this is not possible, use a foot rest. Keep your ears over your shoulders. This will reduce stress on your neck and low back.

## 2022-03-14 NOTE — Progress Notes (Signed)
Chief Complaint  Patient presents with   Follow-up    2 week Long Covid Symptoms     Subjective: Patient is a 36 y.o. male here for f/u headaches.  Seen 2 weeks ago and had a neg workup.  He is at high levels anxiety in addition to continued dull headache posteriorly.  He thinks tight neck muscles are related to this.  He has a burning headache in the back of his head.  He is not on any daily medication.  He is not following with a counselor or psychologist.  Since having COVID in the early fall 2022, he has had various health maladies that has compounded his anxiety.  No homicidal or suicidal ideation.  No self-medication.  No neurologic signs or symptoms.  Past Medical History:  Diagnosis Date   Anxiety    Concussion    multiple from college wrestling, no residual   DEPRESSION    History of kidney stones    Migraines    Nephrolithiasis    PAC (premature atrial contraction)    no cardiology no treatment needed last pac few yrs ago    Objective: BP 120/80   Pulse 63   Temp 98 F (36.7 C) (Oral)   Ht 5\' 10"  (1.778 m)   Wt 215 lb 4 oz (97.6 kg)   SpO2 99%   BMI 30.89 kg/m  General: Awake, appears stated age Heart: RRR, no LE edema Lungs: CTAB, no rales, wheezes or rhonchi. No accessory muscle use MSK: TTP over the suboccipital triangle bilaterally, mild TTP over the cephalad portion of the cervical paraspinal musculature bilaterally, no TMJ TTP Neuro: DTRs equal and symmetric throughout, no clonus, no cerebellar signs, 5/5 strength throughout, gait is normal Psych: Age appropriate judgment and insight, normal affect and mood, did become tearful during the exam  Assessment and Plan: GAD (generalized anxiety disorder) - Plan: venlafaxine XR (EFFEXOR XR) 37.5 MG 24 hr capsule  Chronic daily headache - Plan: venlafaxine XR (EFFEXOR XR) 37.5 MG 24 hr capsule  Chronic, uncontrolled.  Start Effexor 37.5 mg daily.  Counseling information provided.  Anxiety coping techniques  provided.  Recommended continuing to exercise routinely.  Follow-up in 1 month to recheck. Chronic, uncontrolled.  Effexor as above.  Stretches and exercises for the neck also provided. The patient voiced understanding and agreement to the plan.  [place], DO 03/14/22  10:55 AM

## 2022-04-17 ENCOUNTER — Ambulatory Visit (INDEPENDENT_AMBULATORY_CARE_PROVIDER_SITE_OTHER): Payer: 59 | Admitting: Family Medicine

## 2022-04-17 ENCOUNTER — Encounter: Payer: Self-pay | Admitting: Family Medicine

## 2022-04-17 VITALS — BP 122/80 | HR 83 | Temp 98.3°F | Ht 70.0 in | Wt 219.1 lb

## 2022-04-17 DIAGNOSIS — F411 Generalized anxiety disorder: Secondary | ICD-10-CM | POA: Diagnosis not present

## 2022-04-17 DIAGNOSIS — R52 Pain, unspecified: Secondary | ICD-10-CM

## 2022-04-17 MED ORDER — SILDENAFIL CITRATE 100 MG PO TABS: 50.0000 mg | ORAL_TABLET | Freq: Every day | ORAL | 3 refills | Status: DC | PRN

## 2022-04-17 NOTE — Progress Notes (Signed)
Chief Complaint  Patient presents with   Follow-up    Effexor is working well Pain and anxiety is much better    Subjective Jeremy Peterson presents for f/u anxiety/depression.  Pt is currently being treated with Effexor XR 37.5 mg/d.  Reports improvement since treatment w mood, pain, sleep.  Has issue w attention, hx of ADHD, but wants to avoid stimulant at this time. Has issues maintaining strong erections as well since starting med.  No thoughts of harming self or others. No self-medication with alcohol, prescription drugs or illicit drugs. Pt is not following with a counselor/psychologist.  Past Medical History:  Diagnosis Date   Anxiety    Concussion    multiple from college wrestling, no residual   DEPRESSION    History of kidney stones    Migraines    Nephrolithiasis    PAC (premature atrial contraction)    no cardiology no treatment needed last pac few yrs ago   Allergies as of 04/17/2022   No Known Allergies      Medication List        Accurate as of April 17, 2022  1:24 PM. If you have any questions, ask your nurse or doctor.          sildenafil 100 MG tablet Commonly known as: Viagra Take 0.5-1 tablets (50-100 mg total) by mouth daily as needed for erectile dysfunction. Started by: Sharlene Dory, DO   venlafaxine XR 37.5 MG 24 hr capsule Commonly known as: Effexor XR Take 1 capsule (37.5 mg total) by mouth daily with breakfast.        Exam BP 122/80   Pulse 83   Temp 98.3 F (36.8 C) (Oral)   Ht 5\' 10"  (1.778 m)   Wt 219 lb 2 oz (99.4 kg)   SpO2 99%   BMI 31.44 kg/m  General:  well developed, well nourished, in no apparent distress Lungs:  No respiratory distress Psych: well oriented with normal range of affect and age-appropriate judgement/insight, alert and oriented x4.  Assessment and Plan  GAD (generalized anxiety disorder)  Generalized body aches  AE of medication, will continue Effexor at 37.5 mg/d. Will add prn  sildenafil given AE. He does not want to increase dosage or change to another medication at this time which I think is reasonable. Body pain sig improved.  F/u in 6 mo for CPE or prn. The patient voiced understanding and agreement to the plan.  [place], DO 04/17/22 1:24 PM

## 2022-04-17 NOTE — Patient Instructions (Signed)
Use GoodRx for the sildenafil. Take it around 5-6 PM the day of anticipated use.   Keep the diet clean and stay active.  Let us know if you need anything.

## 2022-05-14 ENCOUNTER — Encounter: Payer: Self-pay | Admitting: Internal Medicine

## 2022-06-11 ENCOUNTER — Encounter: Payer: Self-pay | Admitting: Family Medicine

## 2022-06-12 ENCOUNTER — Other Ambulatory Visit: Payer: Self-pay | Admitting: Family Medicine

## 2022-06-12 DIAGNOSIS — R519 Headache, unspecified: Secondary | ICD-10-CM

## 2022-06-12 DIAGNOSIS — F411 Generalized anxiety disorder: Secondary | ICD-10-CM

## 2022-09-20 ENCOUNTER — Other Ambulatory Visit: Payer: Self-pay | Admitting: Family Medicine

## 2022-09-20 DIAGNOSIS — F411 Generalized anxiety disorder: Secondary | ICD-10-CM

## 2022-09-20 DIAGNOSIS — R519 Headache, unspecified: Secondary | ICD-10-CM

## 2022-10-19 ENCOUNTER — Ambulatory Visit (INDEPENDENT_AMBULATORY_CARE_PROVIDER_SITE_OTHER): Payer: Medicaid Other | Admitting: Family Medicine

## 2022-10-19 ENCOUNTER — Encounter: Payer: Self-pay | Admitting: Family Medicine

## 2022-10-19 VITALS — BP 118/72 | HR 84 | Temp 97.7°F | Ht 70.0 in | Wt 220.1 lb

## 2022-10-19 DIAGNOSIS — Z0000 Encounter for general adult medical examination without abnormal findings: Secondary | ICD-10-CM | POA: Diagnosis not present

## 2022-10-19 MED ORDER — FLUTICASONE PROPIONATE 50 MCG/ACT NA SUSP: 2.0000 | Freq: Every day | NASAL | 6 refills | Status: DC

## 2022-10-19 NOTE — Patient Instructions (Addendum)
Give [place] 2-3 business days to get the results of your labs back.   Keep the diet clean and stay active.  Do monthly self testicular checks in the shower. You are feeling for lumps/bumps that don't belong. If you feel anything like this, let me know!  Please get me a copy of your advanced directive form at your convenience.   Let us know if you need anything.  Please consider counseling. Contact [phone] to schedule an appointment or inquire about cost/insurance coverage.  [facility] located at [place], [place], [place].  Phone number = [phone].  Dr. Lennice Sites - Adult Psychiatry.    [facility] located at [place], [place], [place]. Phone number = [phone].   [facility] located at [address], [place], [place].  Phone number = [phone].   [facility] located at [place], [place], [place].  Phone number = [phone].

## 2022-10-19 NOTE — Progress Notes (Signed)
Chief Complaint  Patient presents with   Annual Exam    Anxiety and sleep problems    Well Male Jeremy Peterson is here for a complete physical.   His last physical was >1 year ago.  Current diet: in general, diet is fair.   Current exercise: walking Weight trend: up a few lbs Fatigue out of ordinary? No. Seat belt? Yes.   Advanced directive? No  Health maintenance Tetanus- Yes HIV- Yes Hep C- Yes  Past Medical History:  Diagnosis Date   Anxiety    Concussion    multiple from college wrestling, no residual   DEPRESSION    History of kidney stones    Migraines    Nephrolithiasis    PAC (premature atrial contraction)    no cardiology no treatment needed last pac few yrs ago     Past Surgical History:  Procedure Laterality Date   APPENDECTOMY  2011   CHOLECYSTECTOMY  2018   [place] / TESTICULAR  age 21   right   VASECTOMY Bilateral 03/18/2020   Procedure: VASECTOMY;  Surgeon: Festus Aloe, MD;  Location: [facility];  Service: Urology;  Laterality: Bilateral;    Medications  Current Outpatient Medications on File Prior to Visit  Medication Sig Dispense Refill   sildenafil (VIAGRA) 100 MG tablet Take 0.5-1 tablets (50-100 mg total) by mouth daily as needed for erectile dysfunction. 30 tablet 3   venlafaxine XR (EFFEXOR-XR) 37.5 MG 24 hr capsule Take 1 capsule (37.5 mg total) by mouth daily with breakfast. 90 capsule 0   Allergies No Known Allergies  Family History Family History  Problem Relation Age of Onset   Heart disease Paternal Grandfather    Hyperlipidemia Father    Hypertension Father    Cancer Maternal Grandfather     Review of Systems: Constitutional: no fevers or chills Eye:  no recent significant change in vision Ear/Nose/Mouth/Throat:  Ears:  no hearing loss Nose/Mouth/Throat:  no complaints of nasal congestion, no sore throat Cardiovascular:  no chest pain Respiratory:  no shortness of breath Gastrointestinal:   no abdominal pain, no change in bowel habits GU:  Male: negative for dysuria Musculoskeletal/Extremities:  no new pain of the joints Integumentary (Skin/Breast):  no abnormal skin lesions reported Neurologic:  no headaches Endocrine: No unexpected weight changes Hematologic/Lymphatic:  no night sweats  Exam BP 118/72 (BP Location: Left Arm, Patient Position: Sitting, Cuff Size: Normal)   Pulse 84   Temp 97.7 F (36.5 C) (Oral)   Ht 5' 10"$  (1.778 m)   Wt 220 lb 2 oz (99.8 kg)   SpO2 98%   BMI 31.58 kg/m  General:  well developed, well nourished, in no apparent distress Skin:  no significant moles, warts, or growths Head:  no masses, lesions, or tenderness Eyes:  pupils equal and round, sclera anicteric without injection Ears:  canals without lesions, TMs shiny without retraction, no obvious effusion, no erythema Nose:  nares patent, mucosa normal Throat/Pharynx:  lips and gingiva without lesion; tongue and uvula midline; non-inflamed pharynx; no exudates or postnasal drainage Neck: neck supple without adenopathy, thyromegaly, or masses Lungs:  clear to auscultation, breath sounds equal bilaterally, no respiratory distress Cardio:  regular rate and rhythm, no bruits, no LE edema Abdomen:  abdomen soft, nontender; bowel sounds normal; no masses or organomegaly Genital (male): Deferred Rectal: Deferred Musculoskeletal:  symmetrical muscle groups noted without atrophy or deformity Extremities:  no clubbing, cyanosis, or edema, no deformities, no skin discoloration Neuro:  gait  normal; deep tendon reflexes normal and symmetric Psych: well oriented with normal range of affect and appropriate judgment/insight  Assessment and Plan  Well adult exam - Plan: CBC, Comprehensive metabolic panel, Lipid panel   Well 37 y.o. male. Counseled on diet and exercise. Self testicular exams recommended at least monthly.  Advanced directive form provided today.  Other orders as above. Counseling  information provided in his paperwork should he decide to pursue this. Follow up in 1 year pending the above workup. The patient voiced understanding and agreement to the plan.  [place], DO 10/19/22 3:18 PM

## 2022-10-20 LAB — CBC
HCT: 46.6 % (ref 38.5–50.0)
Hemoglobin: 16 g/dL (ref 13.2–17.1)
MCH: 29.8 pg (ref 27.0–33.0)
MCHC: 34.3 g/dL (ref 32.0–36.0)
MCV: 86.8 fL (ref 80.0–100.0)
MPV: 10.6 fL (ref 7.5–12.5)
Platelets: 226 10*3/uL (ref 140–400)
RBC: 5.37 10*6/uL (ref 4.20–5.80)
RDW: 13.1 % (ref 11.0–15.0)
WBC: 8.7 10*3/uL (ref 3.8–10.8)

## 2022-10-20 LAB — LIPID PANEL
Cholesterol: 236 mg/dL — ABNORMAL HIGH (ref <200)
HDL: 50 mg/dL (ref >=40)
LDL Cholesterol (Calc): 168 mg/dL (calc) — ABNORMAL HIGH
Non-HDL Cholesterol (Calc): 186 mg/dL (calc) — ABNORMAL HIGH (ref <130)
Total CHOL/HDL Ratio: 4.7 (calc) (ref <5.0)
Triglycerides: 80 mg/dL (ref <150)

## 2022-10-20 LAB — COMPREHENSIVE METABOLIC PANEL
AG Ratio: 1.6 (calc) (ref 1.0–2.5)
ALT: 27 U/L (ref 9–46)
AST: 17 U/L (ref 10–40)
Albumin: 4.5 g/dL (ref 3.6–5.1)
Alkaline phosphatase (APISO): 47 U/L (ref 36–130)
BUN: 10 mg/dL (ref 7–25)
CO2: 27 mmol/L (ref 20–32)
Calcium: 9.2 mg/dL (ref 8.6–10.3)
Chloride: 102 mmol/L (ref 98–110)
Creat: 0.93 mg/dL (ref 0.60–1.26)
Globulin: 2.9 g/dL (calc) (ref 1.9–3.7)
Glucose, Bld: 87 mg/dL (ref 65–99)
Potassium: 4.2 mmol/L (ref 3.5–5.3)
Sodium: 140 mmol/L (ref 135–146)
Total Bilirubin: 0.6 mg/dL (ref 0.2–1.2)
Total Protein: 7.4 g/dL (ref 6.1–8.1)

## 2022-10-22 ENCOUNTER — Other Ambulatory Visit: Payer: Self-pay | Admitting: Family Medicine

## 2022-10-22 DIAGNOSIS — R7309 Other abnormal glucose: Secondary | ICD-10-CM

## 2022-10-22 DIAGNOSIS — E785 Hyperlipidemia, unspecified: Secondary | ICD-10-CM

## 2022-10-25 ENCOUNTER — Encounter: Payer: Self-pay | Admitting: Family Medicine

## 2022-10-26 ENCOUNTER — Other Ambulatory Visit: Payer: Self-pay | Admitting: Family Medicine

## 2022-10-26 MED ORDER — PREDNISONE 20 MG PO TABS: 40.0000 mg | ORAL_TABLET | Freq: Every day | ORAL | 0 refills | Status: AC

## 2022-11-21 ENCOUNTER — Encounter: Payer: Self-pay | Admitting: Family Medicine

## 2022-11-21 ENCOUNTER — Ambulatory Visit (INDEPENDENT_AMBULATORY_CARE_PROVIDER_SITE_OTHER): Payer: Medicaid Other | Admitting: Family Medicine

## 2022-11-21 VITALS — BP 108/70 | HR 74 | Temp 97.9°F | Ht 70.0 in | Wt 220.1 lb

## 2022-11-21 DIAGNOSIS — H6993 Unspecified Eustachian tube disorder, bilateral: Secondary | ICD-10-CM

## 2022-11-21 DIAGNOSIS — I889 Nonspecific lymphadenitis, unspecified: Secondary | ICD-10-CM

## 2022-11-21 DIAGNOSIS — M542 Cervicalgia: Secondary | ICD-10-CM | POA: Diagnosis not present

## 2022-11-21 MED ORDER — AMOXICILLIN-POT CLAVULANATE 875-125 MG PO TABS
1.0000 | ORAL_TABLET | Freq: Two times a day (BID) | ORAL | 0 refills | Status: AC
Start: 2022-11-21 — End: 2022-11-28

## 2022-11-21 NOTE — Progress Notes (Signed)
Chief Complaint  Patient presents with   Sinusitis    Ear drainage Dizziness and nausea    Jeremy Peterson here for URI complaints.  Duration: a few weeks has gotten worse Associated symptoms: sinus congestion, sinus pain, rhinorrhea, ear fullness, left lateral neck pain and ear pain Denies: itchy watery eyes, ear drainage, wheezing, shortness of breath, myalgia, and fevers Treatment to date: prednisone burst which did seem to help, INCS (for about a month).  Sick contacts: No  Past Medical History:  Diagnosis Date   Anxiety    Concussion    multiple from college wrestling, no residual   DEPRESSION    History of kidney stones    Migraines    Nephrolithiasis    PAC (premature atrial contraction)    no cardiology no treatment needed last pac few yrs ago    Objective BP 108/70 (BP Location: Left Arm, Patient Position: Sitting, Cuff Size: Normal)   Pulse 74   Temp 97.9 F (36.6 C) (Oral)   Ht 5\' 10"  (1.778 m)   Wt 220 lb 2 oz (99.8 kg)   SpO2 98%   BMI 31.58 kg/m  General: Awake, alert, appears stated age HEENT: AT, [place], ears patent b/l and TM's neg, nares patent w/o discharge, pharynx pink and without exudates, MMM Neck: + Palpable cervical lymph nodes and submandibular gland in the left with TTP Heart: RRR Lungs: CTAB, no accessory muscle use MSK: + TTP over the left lateral neck musculature Psych: Age appropriate judgment and insight, normal mood and affect  Dysfunction of both eustachian tubes - Plan: Ambulatory referral to ENT  Neck pain  Lymphadenitis - Plan: amoxicillin-clavulanate (AUGMENTIN) 875-125 MG tablet  Refer to ENT, he did have some improvement on prednisone.  Continue Flonase.   Stretches and exercises provided.  Heat, ice, Tylenol.  Consider physical therapy. 7 days of Augmentin to cover for possible infectious etiology for lymphadenitis. Pt voiced understanding and agreement to the plan.  [place], DO 11/21/22 12:00 PM

## 2022-11-21 NOTE — Patient Instructions (Addendum)
Continue to push fluids, practice good hand hygiene, and cover your mouth if you cough.  If you start having fevers, shaking or shortness of breath, seek immediate care.  If you do not hear anything about your referral in the next 1-2 weeks, call our office and ask for an update.  Heat (pad or rice pillow in microwave) over affected area, 10-15 minutes twice daily.   Ice/cold pack over area for 10-15 min twice daily.  OK to take Tylenol 1000 mg (2 extra strength tabs) or 975 mg (3 regular strength tabs) every 6 hours as needed.  Let us know if you need anything.  EXERCISES RANGE OF MOTION (ROM) AND STRETCHING EXERCISES  These exercises may help you when beginning to rehabilitate your issue. In order to successfully resolve your symptoms, you must improve your posture. These exercises are designed to help reduce the forward-head and rounded-shoulder posture which contributes to this condition. Your symptoms may resolve with or without further involvement from your physician, physical therapist or athletic trainer. While completing these exercises, remember:  Restoring tissue flexibility helps normal motion to return to the joints. This allows healthier, less painful movement and activity. An effective stretch should be held for at least 20 seconds, although you may need to begin with shorter hold times for comfort. A stretch should never be painful. You should only feel a gentle lengthening or release in the stretched tissue. Do not do any stretch or exercise that you cannot tolerate.  STRETCH- Axial Extensors Lie on your back on the floor. You may bend your knees for comfort. Place a rolled-up hand towel or dish towel, about 2 inches in diameter, under the part of your head that makes contact with the floor. Gently tuck your chin, as if trying to make a "double chin," until you feel a gentle stretch at the base of your head. Hold 15-20 seconds. Repeat 2-3 times. Complete this exercise 1  time per day.   STRETCH - Axial Extension  Stand or sit on a firm surface. Assume a good posture: chest up, shoulders drawn back, abdominal muscles slightly tense, knees unlocked (if standing) and feet hip width apart. Slowly retract your chin so your head slides back and your chin slightly lowers. Continue to look straight ahead. You should feel a gentle stretch in the back of your head. Be certain not to feel an aggressive stretch since this can cause headaches later. Hold for 15-20 seconds. Repeat 2-3 times. Complete this exercise 1 time per day.  STRETCH - Cervical Side Bend  Stand or sit on a firm surface. Assume a good posture: chest up, shoulders drawn back, abdominal muscles slightly tense, knees unlocked (if standing) and feet hip width apart. Without letting your nose or shoulders move, slowly tip your right / left ear to your shoulder until your feel a gentle stretch in the muscles on the opposite side of your neck. Hold 15-20 seconds. Repeat 2-3 times. Complete this exercise 1-2 times per day.  STRETCH - Cervical Rotators  Stand or sit on a firm surface. Assume a good posture: chest up, shoulders drawn back, abdominal muscles slightly tense, knees unlocked (if standing) and feet hip width apart. Keeping your eyes level with the ground, slowly turn your head until you feel a gentle stretch along the back and opposite side of your neck. Hold 15-20 seconds. Repeat 2-3 times. Complete this exercise 1-2 times per day.  RANGE OF MOTION - Neck Circles  Stand or sit on a firm  surface. Assume a good posture: chest up, shoulders drawn back, abdominal muscles slightly tense, knees unlocked (if standing) and feet hip width apart. Gently roll your head down and around from the back of one shoulder to the back of the other. The motion should never be forced or painful. Repeat the motion 10-20 times, or until you feel the neck muscles relax and loosen. Repeat 2-3 times. Complete the exercise  1-2 times per day. STRENGTHENING EXERCISES - Cervical Strain and Sprain These exercises may help you when beginning to rehabilitate your injury. They may resolve your symptoms with or without further involvement from your physician, physical therapist, or athletic trainer. While completing these exercises, remember:  Muscles can gain both the endurance and the strength needed for everyday activities through controlled exercises. Complete these exercises as instructed by your physician, physical therapist, or athletic trainer. Progress the resistance and repetitions only as guided. You may experience muscle soreness or fatigue, but the pain or discomfort you are trying to eliminate should never worsen during these exercises. If this pain does worsen, stop and make certain you are following the directions exactly. If the pain is still present after adjustments, discontinue the exercise until you can discuss the trouble with your clinician.  STRENGTH - Cervical Flexors, Isometric Face a wall, standing about 6 inches away. Place a small pillow, a ball about 6-8 inches in diameter, or a folded towel between your forehead and the wall. Slightly tuck your chin and gently push your forehead into the soft object. Push only with mild to moderate intensity, building up tension gradually. Keep your jaw and forehead relaxed. Hold 10 to 20 seconds. Keep your breathing relaxed. Release the tension slowly. Relax your neck muscles completely before you start the next repetition. Repeat 2-3 times. Complete this exercise 1 time per day.  STRENGTH- Cervical Lateral Flexors, Isometric  Stand about 6 inches away from a wall. Place a small pillow, a ball about 6-8 inches in diameter, or a folded towel between the side of your head and the wall. Slightly tuck your chin and gently tilt your head into the soft object. Push only with mild to moderate intensity, building up tension gradually. Keep your jaw and forehead  relaxed. Hold 10 to 20 seconds. Keep your breathing relaxed. Release the tension slowly. Relax your neck muscles completely before you start the next repetition. Repeat 2-3 times. Complete this exercise 1 time per day.  STRENGTH - Cervical Extensors, Isometric  Stand about 6 inches away from a wall. Place a small pillow, a ball about 6-8 inches in diameter, or a folded towel between the back of your head and the wall. Slightly tuck your chin and gently tilt your head back into the soft object. Push only with mild to moderate intensity, building up tension gradually. Keep your jaw and forehead relaxed. Hold 10 to 20 seconds. Keep your breathing relaxed. Release the tension slowly. Relax your neck muscles completely before you start the next repetition. Repeat 2-3 times. Complete this exercise 1 time per day.  POSTURE AND BODY MECHANICS CONSIDERATIONS Keeping correct posture when sitting, standing or completing your activities will reduce the stress put on different body tissues, allowing injured tissues a chance to heal and limiting painful experiences. The following are general guidelines for improved posture. Your physician or physical therapist will provide you with any instructions specific to your needs. While reading these guidelines, remember: The exercises prescribed by your provider will help you have the flexibility and strength to maintain  correct postures. The correct posture provides the optimal environment for your joints to work. All of your joints have less wear and tear when properly supported by a spine with good posture. This means you will experience a healthier, less painful body. Correct posture must be practiced with all of your activities, especially prolonged sitting and standing. Correct posture is as important when doing repetitive low-stress activities (typing) as it is when doing a single heavy-load activity (lifting).  PROLONGED STANDING WHILE SLIGHTLY LEANING  FORWARD When completing a task that requires you to lean forward while standing in one place for a long time, place either foot up on a stationary 2- to 4-inch high object to help maintain the best posture. When both feet are on the ground, the low back tends to lose its slight inward curve. If this curve flattens (or becomes too large), then the back and your other joints will experience too much stress, fatigue more quickly, and can cause pain.   RESTING POSITIONS Consider which positions are most painful for you when choosing a resting position. If you have pain with flexion-based activities (sitting, bending, stooping, squatting), choose a position that allows you to rest in a less flexed posture. You would want to avoid curling into a fetal position on your side. If your pain worsens with extension-based activities (prolonged standing, working overhead), avoid resting in an extended position such as sleeping on your stomach. Most people will find more comfort when they rest with their spine in a more neutral position, neither too rounded nor too arched. Lying on a non-sagging bed on your side with a pillow between your knees, or on your back with a pillow under your knees will often provide some relief. Keep in mind, being in any one position for a prolonged period of time, no matter how correct your posture, can still lead to stiffness.  WALKING Walk with an upright posture. Your ears, shoulders, and hips should all line up. OFFICE WORK When working at a desk, create an environment that supports good, upright posture. Without extra support, muscles fatigue and lead to excessive strain on joints and other tissues.  CHAIR: A chair should be able to slide under your desk when your back makes contact with the back of the chair. This allows you to work closely. The chair's height should allow your eyes to be level with the upper part of your monitor and your hands to be slightly lower than your  elbows. Body position: Your feet should make contact with the floor. If this is not possible, use a foot rest. Keep your ears over your shoulders. This will reduce stress on your neck and low back.

## 2022-12-04 ENCOUNTER — Other Ambulatory Visit (INDEPENDENT_AMBULATORY_CARE_PROVIDER_SITE_OTHER): Payer: Medicaid Other

## 2022-12-04 DIAGNOSIS — R7309 Other abnormal glucose: Secondary | ICD-10-CM | POA: Diagnosis not present

## 2022-12-04 DIAGNOSIS — E785 Hyperlipidemia, unspecified: Secondary | ICD-10-CM | POA: Diagnosis not present

## 2022-12-04 LAB — LIPID PANEL
Cholesterol: 241 mg/dL — ABNORMAL HIGH (ref 0–200)
HDL: 51 mg/dL (ref >39.00)
LDL Cholesterol: 169 mg/dL — ABNORMAL HIGH (ref 0–99)
NonHDL: 190.34
Total CHOL/HDL Ratio: 5
Triglycerides: 108 mg/dL (ref 0.0–149.0)
VLDL: 21.6 mg/dL (ref 0.0–40.0)

## 2022-12-06 ENCOUNTER — Other Ambulatory Visit: Payer: Self-pay | Admitting: Family Medicine

## 2022-12-06 ENCOUNTER — Telehealth: Payer: Self-pay | Admitting: Family Medicine

## 2022-12-06 MED ORDER — ROSUVASTATIN CALCIUM 20 MG PO TABS: 20.0000 mg | ORAL_TABLET | Freq: Every day | ORAL | 3 refills | Status: DC

## 2022-12-06 NOTE — Telephone Encounter (Signed)
Patient called stating he is ok starting the medication per his last labs, since he believes the high cholesterol is mainly genetic. Patient set up a future lab appt for 01/21/23 to recheck labs again. Please advise.

## 2022-12-06 NOTE — Telephone Encounter (Signed)
Pt.notified

## 2022-12-21 ENCOUNTER — Other Ambulatory Visit: Payer: Self-pay | Admitting: Family Medicine

## 2022-12-21 DIAGNOSIS — F411 Generalized anxiety disorder: Secondary | ICD-10-CM

## 2022-12-21 DIAGNOSIS — R519 Headache, unspecified: Secondary | ICD-10-CM

## 2023-01-21 ENCOUNTER — Other Ambulatory Visit (INDEPENDENT_AMBULATORY_CARE_PROVIDER_SITE_OTHER): Payer: Medicaid Other

## 2023-01-21 DIAGNOSIS — E7800 Pure hypercholesterolemia, unspecified: Secondary | ICD-10-CM

## 2023-01-21 LAB — LIPID PANEL
Cholesterol: 142 mg/dL (ref 0–200)
HDL: 48.9 mg/dL (ref >39.00)
LDL Cholesterol: 75 mg/dL (ref 0–99)
NonHDL: 92.85
Total CHOL/HDL Ratio: 3
Triglycerides: 88 mg/dL (ref 0.0–149.0)
VLDL: 17.6 mg/dL (ref 0.0–40.0)

## 2023-01-21 NOTE — Progress Notes (Signed)
Patient here for follow up labs after new medication for LDL  no future orders in epic. Placed order per Dr. Antoine Poche message from 12/04/2022 repeat lipid panel.   Hi Jeremy Peterson- Thanks for coming in for labs. LDL (bad cholesterol) is high still. I do recommend a medicine to lower this. If you agree to the new medication, we will send in the medication and follow up in 6 weeks to recheck your labs. Let us know.

## 2023-02-05 ENCOUNTER — Other Ambulatory Visit: Payer: Self-pay | Admitting: Family Medicine

## 2023-02-27 ENCOUNTER — Encounter: Payer: Self-pay | Admitting: Family Medicine

## 2023-02-27 ENCOUNTER — Ambulatory Visit (INDEPENDENT_AMBULATORY_CARE_PROVIDER_SITE_OTHER): Payer: Medicaid Other | Admitting: Family Medicine

## 2023-02-27 VITALS — BP 120/80 | HR 84 | Temp 98.5°F | Ht 70.0 in | Wt 220.4 lb

## 2023-02-27 DIAGNOSIS — F411 Generalized anxiety disorder: Secondary | ICD-10-CM | POA: Diagnosis not present

## 2023-02-27 DIAGNOSIS — R1032 Left lower quadrant pain: Secondary | ICD-10-CM | POA: Diagnosis not present

## 2023-02-27 MED ORDER — VENLAFAXINE HCL ER 75 MG PO CP24: 75.0000 mg | ORAL_CAPSULE | Freq: Every day | ORAL | 1 refills | Status: DC

## 2023-02-27 NOTE — Patient Instructions (Addendum)
Ice/cold pack over area for 10-15 min twice daily.  OK to take Tylenol 1000 mg (2 extra strength tabs) or 975 mg (3 regular strength tabs) every 6 hours as needed.  Ibuprofen 400-600 mg (2-3 over the counter strength tabs) every 6 hours as needed for pain.  Let us know if you need anything. 

## 2023-02-27 NOTE — Progress Notes (Addendum)
Chief Complaint  Patient presents with   Hernia    Jeremy Peterson is a 37 y.o. male here for a skin complaint.  Duration: 2 days Location: L groin Pruritic? No Painful? Yes Drainage? No Fevers? No Redness? No Other associated symptoms: noticed it after a hard cough and felt a pop in that area No new testicular pain.  Therapies tried thus far: heating pad  Patient has a history of generalized anxiety.  He was on Effexor XR 37.5 mg daily but has noticed some apathy issues.  He is wondering if he needs to come off of it or whether he needs to increase the dosage.  He feels his motivation is lower and he is not as productive as he used to be.  The patient is in the process of scheduling an appointment with a therapist.  Past Medical History:  Diagnosis Date   Anxiety    Concussion    multiple from college wrestling, no residual   DEPRESSION    History of kidney stones    Migraines    Nephrolithiasis    PAC (premature atrial contraction)    no cardiology no treatment needed last pac few yrs ago    BP 120/80 (BP Location: Left Arm, Patient Position: Sitting, Cuff Size: Large)   Pulse 84   Temp 98.5 F (36.9 C) (Oral)   Ht 5\' 10"  (1.778 m)   Wt 220 lb 6 oz (100 kg)   SpO2 98%   BMI 31.62 kg/m  Gen: awake, alert, appearing stated age Lungs: No accessory muscle use GU: Gross swelling noted in the left inguinal region compared to the left.  There is TTP over this area as well. I do not appreciate any bowel in the inguinal canal but there is tenderness when inserting my finger in the area.. No erythema, excoriated lesions, vesicles, or excessive warmth Psych: Age appropriate judgment and insight  Left inguinal pain - Plan: US Abdomen Limited  GAD (generalized anxiety disorder) - Plan: venlafaxine XR (EFFEXOR XR) 75 MG 24 hr capsule  Maybe a fat containing hernia. Ck [place] and proceed  accordingly.  Chronic, uncontrolled.  Effexor was causing some apathy at the 37.5 mg dosage.   We will increase Effexor XR to 75 mg/d.  It seems he is going to set up with a counselor.  Hopefully this works well and we can start to wean down off of the medication altogether.  Follow-up in 1 month to recheck this. The patient voiced understanding and agreement to the plan.  Jilda Roche [place], DO 02/27/23 10:59 AM

## 2023-03-01 ENCOUNTER — Ambulatory Visit (HOSPITAL_BASED_OUTPATIENT_CLINIC_OR_DEPARTMENT_OTHER)
Admission: RE | Admit: 2023-03-01 | Discharge: 2023-03-01 | Disposition: A | Discharge status: Home / Self Care | Payer: Medicaid Other | Source: Ambulatory Visit | Attending: Family Medicine | Admitting: Family Medicine

## 2023-03-01 ENCOUNTER — Other Ambulatory Visit: Payer: Self-pay | Admitting: Family Medicine

## 2023-03-01 DIAGNOSIS — R1032 Left lower quadrant pain: Secondary | ICD-10-CM | POA: Insufficient documentation

## 2023-03-01 DIAGNOSIS — F411 Generalized anxiety disorder: Secondary | ICD-10-CM

## 2023-03-22 ENCOUNTER — Other Ambulatory Visit: Payer: Self-pay | Admitting: Family Medicine

## 2023-03-22 DIAGNOSIS — F411 Generalized anxiety disorder: Secondary | ICD-10-CM

## 2023-03-22 DIAGNOSIS — R519 Headache, unspecified: Secondary | ICD-10-CM

## 2023-04-04 ENCOUNTER — Ambulatory Visit: Payer: Medicaid Other | Admitting: Family Medicine

## 2023-08-23 ENCOUNTER — Encounter: Payer: Self-pay | Admitting: Family Medicine

## 2023-08-23 ENCOUNTER — Ambulatory Visit (INDEPENDENT_AMBULATORY_CARE_PROVIDER_SITE_OTHER): Payer: Medicaid Other | Admitting: Family Medicine

## 2023-08-23 VITALS — BP 139/99 | HR 60 | Temp 97.7°F | Ht 70.0 in | Wt 228.0 lb

## 2023-08-23 DIAGNOSIS — L0291 Cutaneous abscess, unspecified: Secondary | ICD-10-CM | POA: Diagnosis not present

## 2023-08-23 MED ORDER — DOXYCYCLINE HYCLATE 100 MG PO TABS
100.0000 mg | ORAL_TABLET | Freq: Two times a day (BID) | ORAL | 0 refills | Status: AC
Start: 2023-08-23 — End: 2023-08-30

## 2023-08-23 NOTE — Progress Notes (Signed)
Acute Office Visit  Subjective:     Patient ID: Jeremy Peterson, male    DOB: May 25, 1986, 37 y.o.   MRN: [id number]  Chief Complaint  Patient presents with   Abscess     Patient is in today for skin lesions.   Discussed the use of AI scribe software for clinical note transcription with the patient, who gave verbal consent to proceed.  History of Present Illness   The patient presented with multiple abscesses that appeared approximately two weeks prior. The abscesses, located on the left inner arm and in the armpit, initially drained a colored fluid but have since stopped. The patient reported a growing sensation of pressure and sharp pain radiating from the abscesses to the lower arm and back to the shoulder blade. They also noted numbness in the arm when raised above the head for an extended period of time.   The patient and their spouse attempted to drain the upper abscess at home, which initially seemed successful but later seemed to shift the fluid to the lower abscess. The patient also reported bruising in the armpit, which has since improved.  The patient has been applying warm compresses and used Neosporin when the abscess was open. They reported possible chills but typically resist fevers.           ROS All review of systems negative except what is listed in the HPI      Objective:    BP (!) 139/99   Pulse 60   Temp 97.7 F (36.5 C) (Oral)   Ht 5\' 10"  (1.778 m)   Wt 228 lb (103.4 kg)   SpO2 99%   BMI 32.71 kg/m    Physical Exam Vitals reviewed.  Constitutional:      Appearance: Normal appearance.  Skin:    General: Skin is warm and dry.     Comments: See pictures of left upper inner arm and left axillary lesions, mild erythema and inflammation, but no fluctuance   Neurological:     Mental Status: He is alert and oriented to person, place, and time.  Psychiatric:        Mood and Affect: Mood normal.        Behavior: Behavior normal.        Thought  Content: Thought content normal.        Judgment: Judgment normal.                  No results found for any visits on 08/23/23.      Assessment & Plan:   Problem List Items Addressed This Visit   None Visit Diagnoses       Abscess    -  Primary   Relevant Medications   doxycycline (VIBRA-TABS) 100 MG tablet      Multiple areas on left upper arm and axilla with associated lymphadenopathy. Abscesses have been draining purulent fluid but have recently stopped. Patient reports pain and pressure sensation. No systemic symptoms reported. -Start Doxycycline for 5-7 days  -Continue warm compresses 4-5 times a day. -Apply Neosporin if any abscesses open and drain. -Return to clinic if condition worsens or does not improve within a week.        Meds ordered this encounter  Medications   doxycycline (VIBRA-TABS) 100 MG tablet    Sig: Take 1 tablet (100 mg total) by mouth 2 (two) times daily for 7 days.    Dispense:  14 tablet    Refill:  0  Supervising Provider:   Bradd Canary [phone]    Return if symptoms worsen or fail to improve.  [place] Dana, NP

## 2023-09-03 ENCOUNTER — Encounter: Payer: Self-pay | Admitting: Family Medicine

## 2023-09-03 ENCOUNTER — Ambulatory Visit (INDEPENDENT_AMBULATORY_CARE_PROVIDER_SITE_OTHER): Payer: Medicaid Other | Admitting: Family Medicine

## 2023-09-03 VITALS — BP 130/86 | HR 78 | Temp 98.0°F | Resp 16 | Ht 70.0 in | Wt 223.8 lb

## 2023-09-03 DIAGNOSIS — F411 Generalized anxiety disorder: Secondary | ICD-10-CM

## 2023-09-03 MED ORDER — CITALOPRAM HYDROBROMIDE 10 MG PO TABS
10.0000 mg | ORAL_TABLET | Freq: Every day | ORAL | 3 refills | Status: DC
Start: 2023-09-03 — End: 2023-10-22

## 2023-09-03 MED ORDER — GABAPENTIN 300 MG PO CAPS
300.0000 mg | ORAL_CAPSULE | Freq: Three times a day (TID) | ORAL | 3 refills | Status: DC | PRN
Start: 2023-09-03 — End: 2023-10-22

## 2023-09-03 NOTE — Progress Notes (Signed)
 Chief Complaint  Patient presents with   Follow-up    Follow up    Subjective Jeremy Peterson presents for f/u anxiety/depression.  Pt is currently being treated with nothing; failed Effexor  XR.  Reports struggling over the past 6 mo since treatment with anxiety, slight depression. He is a physicist, medical and working No thoughts of harming self or others. No self-medication with alcohol, prescription drugs or illicit drugs. Pt is not following with a counselor/psychologist.  Past Medical History:  Diagnosis Date   Anxiety    Concussion    multiple from college wrestling, no residual   DEPRESSION    History of kidney stones    Migraines    Nephrolithiasis    PAC (premature atrial contraction)    no cardiology no treatment needed last pac few yrs ago   Allergies as of 09/03/2023   No Known Allergies      Medication List        Accurate as of September 03, 2023  1:08 PM. If you have any questions, ask your nurse or doctor.          citalopram  10 MG tablet Commonly known as: CELEXA  Take 1 tablet (10 mg total) by mouth daily. Started by: Mabel Deward Pry   gabapentin  300 MG capsule Commonly known as: NEURONTIN  Take 1 capsule (300 mg total) by mouth 3 (three) times daily as needed (Anxiety). Started by: Mabel Deward Pry        Exam BP 130/86   Pulse 78   Temp 98 F (36.7 C) (Oral)   Resp 16   Ht 5' 10 (1.778 m)   Wt 223 lb 12.8 oz (101.5 kg)   SpO2 98%   BMI 32.11 kg/m  General:  well developed, well nourished, in no apparent distress Lungs:  No respiratory distress Psych: well oriented with normal range of affect and age-appropriate judgement/insight, alert and oriented x4.  Assessment and Plan  GAD (generalized anxiety disorder) - Plan: Ambulatory referral to Psychology, citalopram  (CELEXA ) 10 MG tablet, gabapentin  (NEURONTIN ) 300 MG capsule  Chronic, unstable.  Start Celexa  10 mg daily, gabapentin  300 mg 3 times daily as needed.   Referral to counseling today as well as self-referral instructions.  Counseled on exercise. F/u in 6 weeks. The patient voiced understanding and agreement to the plan.  Mabel Deward [place], DO 09/03/23 1:08 PM

## 2023-09-03 NOTE — Patient Instructions (Signed)
Please consider counseling. Contact [phone] to schedule an appointment or inquire about cost/insurance coverage.  [facility] located at [address], [place], [place].  Phone number = [phone].  Dr. Onoriode Edeh - Adult Psychiatry.    [facility] located at [address], [place], [place]. Phone number = [phone].   [facility] located at [address], [place], [place].  Phone number = [phone].   [facility] located at [address], [place], [place].  Phone number = [phone].  Aim to do some physical exertion for 150 minutes per week. This is typically divided into 5 days per week, 30 minutes per day. The activity should be enough to get your heart rate up. Anything is better than nothing if you have time constraints.  Let us know if you need anything.  

## 2023-10-15 ENCOUNTER — Ambulatory Visit: Payer: Medicaid Other | Admitting: Family Medicine

## 2023-10-22 ENCOUNTER — Ambulatory Visit (INDEPENDENT_AMBULATORY_CARE_PROVIDER_SITE_OTHER): Payer: Medicaid Other | Admitting: Family Medicine

## 2023-10-22 ENCOUNTER — Encounter: Payer: Self-pay | Admitting: Family Medicine

## 2023-10-22 VITALS — BP 128/76 | HR 82 | Temp 98.0°F | Resp 16 | Ht 70.0 in | Wt 221.8 lb

## 2023-10-22 DIAGNOSIS — F411 Generalized anxiety disorder: Secondary | ICD-10-CM

## 2023-10-22 MED ORDER — GABAPENTIN 300 MG PO CAPS: 600.0000 mg | ORAL_CAPSULE | Freq: Three times a day (TID) | ORAL | Status: DC | PRN

## 2023-10-22 MED ORDER — CITALOPRAM HYDROBROMIDE 20 MG PO TABS: 20.0000 mg | ORAL_TABLET | Freq: Every day | ORAL | 3 refills | Status: DC

## 2023-10-22 NOTE — Progress Notes (Signed)
Chief Complaint  Patient presents with   Follow-up    Follow up    Subjective Jeremy Peterson presents for f/u anxiety.  Pt is currently being treated with Celexa 10 mg/d.  Reports doing around 20% improvement since treatment. No AE's, compliant.  No thoughts of harming self or others. No self-medication with alcohol, prescription drugs or illicit drugs. He is exercising.  Pt is not following with a counselor/psychologist.  Past Medical History:  Diagnosis Date   Anxiety    Concussion    multiple from college wrestling, no residual   DEPRESSION    History of kidney stones    Migraines    Nephrolithiasis    PAC (premature atrial contraction)    no cardiology no treatment needed last pac few yrs ago   Allergies as of 10/22/2023   No Known Allergies      Medication List        Accurate as of October 22, 2023  2:51 PM. If you have any questions, ask your nurse or doctor.          citalopram 20 MG tablet Commonly known as: CELEXA Take 1 tablet (20 mg total) by mouth daily. What changed:  medication strength how much to take Changed by: Sharlene Dory   gabapentin 300 MG capsule Commonly known as: NEURONTIN Take 2 capsules (600 mg total) by mouth 3 (three) times daily as needed (Anxiety). What changed: how much to take Changed by: Sharlene Dory        Exam BP 128/76 (BP Location: Left Arm, Patient Position: Sitting)   Pulse 82   Temp 98 F (36.7 C) (Oral)   Resp 16   Ht 5\' 10"  (1.778 m)   Wt 221 lb 12.8 oz (100.6 kg)   SpO2 99%   BMI 31.82 kg/m  General:  well developed, well nourished, in no apparent distress Lungs:  No respiratory distress Psych: well oriented with normal range of affect and age-appropriate judgement/insight, alert and oriented x4.  Assessment and Plan  GAD (generalized anxiety disorder) - Plan: citalopram (CELEXA) 20 MG tablet, gabapentin (NEURONTIN) 300 MG capsule  Chronic, uncontrolled. Increase Celexa  20 mg/d. He has initiated the process to get set up with the counseling team. Counseled on exercise.  F/u in 6 weeks. The patient voiced understanding and agreement to the plan.  Jilda Roche [place], DO 10/22/23 2:51 PM

## 2023-10-28 ENCOUNTER — Ambulatory Visit (INDEPENDENT_AMBULATORY_CARE_PROVIDER_SITE_OTHER): Payer: Medicaid Other | Admitting: Psychology

## 2023-10-28 DIAGNOSIS — F900 Attention-deficit hyperactivity disorder, predominantly inattentive type: Secondary | ICD-10-CM

## 2023-10-28 DIAGNOSIS — F411 Generalized anxiety disorder: Secondary | ICD-10-CM | POA: Diagnosis not present

## 2023-10-28 NOTE — Progress Notes (Signed)
 Comprehensive Clinical Assessment (CCA) Note  10/28/2023 Jeremy Peterson [id number]  Time Spent: 9:00  am - 9:57 am: 57 Minutes  Chief Complaint: No chief complaint on file.  Visit Diagnosis: Anxiety, Depression, ADHD.    Guardian/Payee:  Self.     Paperwork requested: No   Reason for Visit /Presenting Problem:  Anxiety, Depression, ADHD.   Mental Status Exam: Appearance:   Casual     Behavior:  Appropriate  Motor:  Normal  Speech/Language:   Clear and Coherent  Affect:  Congruent  Mood:  anxious and dysthymic  Thought process:  normal  Thought content:    WNL  Sensory/Perceptual disturbances:    WNL  Orientation:  oriented to person, place, time/date, and situation  Attention:  Good  Concentration:  Good  Memory:  WNL  Fund of knowledge:   Good  Insight:    Good  Judgment:   Good  Impulse Control:  Good   Reported Symptoms:  Depression, anxiety, and ADHD.   Risk Assessment: Danger to Self:  No, but history of. He noted his thought being "better off not here". He noted more significant SI occurring around Christmas.  Self-injurious Behavior: No Danger to Others: No Duty to Warn:no Physical Aggression / Violence:No  Access to Firearms a concern: No  Gang Involvement:No  Patient / guardian was educated about steps to take if suicide or homicide risk level increases between visits: yes While future psychiatric events cannot be accurately predicted, the patient does not currently require acute inpatient psychiatric care and does not currently meet [facility] involuntary commitment criteria.  In case of a mental health emergency:  66 - confidential suicide hotline. [facility]):        [address], [place] [address]       [phone] 3.   911  4.   Visiting Nearest ED.   Substance Abuse History: Current substance abuse: No     Caffeine: 3x coffee (am, mid day, night time). Tobacco: denied.  Alcohol:  denied.  Substance use: Daily marijuana smoking "but not real heavy" - couple times a day.   Past Psychiatric History:   Previous psychological history is significant for ADHD, anxiety, and depression.  He would take it PRN but wouldn't take it as prescribed. Noted this resulting in anxiety. He noted taking meds multiple at once.  Diagnosed with Depression and anxiety ~38 years old. Was prescribed an anti-depressant ~38 years old.   Outpatient Providers: History of counseling ~ early 11s.  History of Psych Hospitalization: No  Psychological Testing: Attention/ADHD:  .    Paternal grandfather: Severe Depression. History of SI after discharged from [employer]. Father Depression.  Mother Anxiety.   Abuse History:  Victim of: No.,  na    Report needed: No. Victim of Neglect:No. Perpetrator of  na   Witness / Exposure to Domestic Violence: No   Protective Services Involvement: No  Witness to [employer] Violence:  No   Family History:  Family History  Problem Relation Age of Onset   Heart disease Paternal Grandfather    Hyperlipidemia Father    Hypertension Father    Cancer Maternal Grandfather     Living situation: the patient lives with their family  Sexual Orientation: Straight  Relationship Status: married  Name of spouse / other:   Tacey Ruiz, Married for 10 years.  If a parent, number of children / ages:6 kids in total. 3 children together, Arad has daughter, and Tacey Ruiz has two  children.   Biological  Emmy 3 daughter,  Julious Oka 6 daughter,  Adeline 7 daughter, Samuel Bouche 42 year old son, Madelin Rear 12 year old son (had to leave the house), Shae 68 year old daughter (Marton's daughter - "haven't seen her in years, its not great". ).    Support Systems: spouse (getting better at it). He noted that he is a "clam". "I can talk to my mom and dad a bit but they travel a lot. I don't want to worry her (mom)". "Dad is not the guy to call to share your feelings with".   Financial Stress:  Yes "it's  not terrible". "I am not generating income and I have a large family".   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   Military Service: No   Educational History: Education: some college. 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 Has a 4.0 GPA and a year left to get a AA.   Religion/Sprituality/World View: Christian  Any cultural differences that may affect / interfere with treatment:  not applicable   Recreation/Hobbies: Video games (very unbalanced), currently spending time with family and starting exercise.   He noted video games being "disassociating from reality" and that he previously did "too much".   Stressors: Financial difficulties   Other: mood    Strengths: Supportive Relationships, Family, Church, Hopefulness, Journalist, newspaper, and Able to Communicate Effectively  Barriers:  NA   Legal History: Pending legal issue / charges: The patient has no significant history of legal issues. History of legal issue / charges:  na  Medical History/Surgical History: reviewed Past Medical History:  Diagnosis Date   Anxiety    Concussion    multiple from college wrestling, no residual   DEPRESSION    History of kidney stones    Migraines    Nephrolithiasis    PAC (premature atrial contraction)    no cardiology no treatment needed last pac few yrs ago    Past Surgical History:  Procedure Laterality Date   APPENDECTOMY  2011   CHOLECYSTECTOMY  2018   SURGERY SCROTAL / TESTICULAR  age 11   right   VASECTOMY Bilateral 03/18/2020   Procedure: VASECTOMY;  Surgeon: Jerilee Field, MD;  Location: [facility];  Service: Urology;  Laterality: Bilateral;    Medications: Current Outpatient Medications  Medication Sig Dispense Refill   citalopram (CELEXA) 20 MG tablet Take 1 tablet (20 mg total) by mouth daily. 30 tablet 3   gabapentin (NEURONTIN) 300 MG capsule Take 2 capsules  (600 mg total) by mouth 3 (three) times daily as needed (Anxiety).     No current facility-administered medications for this visit.    No Known Allergies  Diagnoses:  Generalized anxiety disorder  Psychiatric Treatment: Yes , via PCP. See chart for medication list.   Plan of Care: OPT and Psychiatric consult.  Narrative:   Mearl Latin Thresher participated from home, via video, is aware of tele-sessions limitations, and consented to treatment. Therapist participated from office. We reviewed the limits of confidentiality prior to the start of the evaluation. Yanuel P Watters expressed understanding and agreement to proceed. "I have always dealt with anxiety in my life" and noted this being more manageable in the past. Braxtin noted the onset of his symptoms around 1 years ago. He noted his anxiety primary related to health conditions. He endorsed symptoms of panic and noted visiting the ED, in the past, due to his symptoms. He noted a history  of severe Covid infections and noted being "long Covid". He noted this experience resulting catastrophizing, noting previously experiencing a mild pain and thinking this leading into worry about passing. He noted "something will go wrong if I have to leave the house". He noted his PCP prescribing Celexa and Avir noted this being helpful in reducing his symptoms. He noted taking his medication consistently. He noted working 7 years without "punching out" and noted often being on call consistently. He noted this affected his sleep and ability to spend time with family. He noted "suddenly and randomly" quit his work, around a year ago, after being prescribed Effexor and taking it for some time. He noted this being "impulsive" and endorsed higher levels of energy, more interest in sex, making decisions that are risky. He will completed the MDQ, between sessions, as well. He would benefit from a psychiatric consult and resources will be provided during our follow-up. With  his ADHD diagnosis, he noted this impulsivity affecting across settings and noted this affecting his spending. 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 He has a history of Anxiety, depression, and ADHD. He noted continued struggles with ADHD symptoms specifically focus. He has a history of being prescribed a stimulant, in the past, and noted focus improving but noted his anxiety rising. History of SI most recently around Christmas but denied any current SI. He noted recently having thoughts "better off not here". We reviewed safety during the session, resources provided via email, and Havier will complete the [employer] safety plan prior to the follow-up. Kathy presented as forthcoming, open, and motivated for change. A follow-up was scheduled to create a treatment plan and begin treatment. Therapist answered all questions during the evaluation and contact information was provided.   GAD-7: 17 PHQ-9: [address], LCSW

## 2023-11-03 ENCOUNTER — Encounter: Payer: Self-pay | Admitting: Family Medicine

## 2023-11-04 ENCOUNTER — Other Ambulatory Visit: Payer: Self-pay | Admitting: Family Medicine

## 2023-11-04 MED ORDER — ROSUVASTATIN CALCIUM 20 MG PO TABS: 20.0000 mg | ORAL_TABLET | Freq: Every day | ORAL | 3 refills | Status: AC

## 2023-11-06 ENCOUNTER — Ambulatory Visit: Payer: Medicaid Other | Admitting: Psychology

## 2023-11-06 ENCOUNTER — Telehealth

## 2023-11-06 ENCOUNTER — Encounter: Payer: Self-pay | Admitting: Psychology

## 2023-11-06 DIAGNOSIS — F900 Attention-deficit hyperactivity disorder, predominantly inattentive type: Secondary | ICD-10-CM

## 2023-11-06 DIAGNOSIS — F411 Generalized anxiety disorder: Secondary | ICD-10-CM | POA: Diagnosis not present

## 2023-11-06 NOTE — Progress Notes (Signed)
 [facility] Counselor/Therapist Progress Note  Patient ID: Jeremy Peterson, MRN: [id number]   Date: 11/06/23  Time Spent: 11:09  am - 11:58 am : 49 Minutes  Treatment Type: Individual Therapy.  Reported Symptoms: Anxiety and depression.   Mental Status Exam: Appearance:  Casual     Behavior: Appropriate  Motor: Normal  Speech/Language:  Clear and Coherent  Affect: Appropriate and Congruent  Mood: dysthymic  Thought process: normal  Thought content:   WNL  Sensory/Perceptual disturbances:   WNL  Orientation: oriented to person, place, time/date, and situation  Attention: Good  Concentration: Good  Memory: WNL  Fund of knowledge:  Good  Insight:   Good  Judgment:  Good  Impulse Control: Good    Risk Assessment: Danger to Self:  No Self-injurious Behavior: No Danger to Others: No Duty to Warn:no Physical Aggression / Violence:No  Access to Firearms a concern: No  Gang Involvement:No   In case of a mental health emergency:  7 - confidential suicide hotline. [facility]):        [address], [place] [address]       [phone] 3.   911  4.   Visiting Nearest ED.    Subjective:   Jeremy Peterson participated from home, via video and consented to treatment. Therapist participated from home office. I discussed the limitations of evaluation and management by telemedicine and the availability of in person appointments. The patient expressed understanding and agreed to proceed. [name] reviewed the events of the past week. We reviewed numerous treatment approaches including CBT, BA, Problem Solving, and Solution focused therapy. Psych-education regarding the Jeremy Peterson's diagnosis of Generalized anxiety disorder  Attention deficit hyperactivity disorder (ADHD), predominantly inattentive type was provided during the session. We discussed Jeremy Peterson's goals treatment goals which include  mood balance, resolving  interpersonal conflict, processing past events, managing self-talk, mindfulness, setting boundaries, self-care, managing interpersonal stressors, work on barriers towards employment, developing sustainable day-to-day routine, address marital stressors, improve communication, manage rumination, improve self-esteem. Jeremy Peterson provided verbal approval of the treatment plan. Jeremy Peterson denied any safety concerns during the session and has resources for safety should the need arise. He will complete the Duffy Rhody and [employer] between sessions.   Interventions: Psycho-education & Goal Setting.   Diagnosis:  Generalized anxiety disorder  Attention deficit hyperactivity disorder (ADHD), predominantly inattentive type  Psychiatric Treatment: Yes, via PCP.    Treatment Plan:  Client Abilities/Strengths Jeremy Peterson is forthcoming and motivated for change.   Support System: Family and friends.   Client Treatment Preferences Outpatient therapy.   Client Statement of Needs Jeremy Peterson would like to maintain mood balance, resolving interpersonal conflict, processing past events, managing self-talk, mindfulness, setting boundaries, self-care, managing interpersonal stressors, work on barriers towards employment, developing sustainable day-to-day routine, address marital stressors, improve communication, manage rumination, improve self-esteem. Additional goals include maintaining safety and completed safety plan.   Treatment Level Weekly  Symptoms  Depression:    (Status: maintained) Anxiety:    (Status: maintained)  Goals:   Jeremy Peterson experiences symptoms of depression and anxiety.  Treatment plan signed and available on s-drive:  No, pending signature.    Target Date: 11/05/24 Frequency: Weekly  Progress: 0 Modality: individual    Therapist will provide referrals for additional resources as appropriate.  Therapist will provide psycho-education regarding Jeremy Peterson's diagnosis and corresponding  treatment approaches and interventions. [name], LCSW will support the patient's ability to achieve the goals identified.  will employ CBT, BA, Problem-solving, Solution Focused, Mindfulness,  coping skills, & other evidenced-based practices will be used to promote progress towards healthy functioning to help manage decrease symptoms associated with his diagnosis.   Reduce overall level, frequency, and intensity of the feelings of depression, anxiety and panic evidenced by decreased overall symptoms from 6 to 7 days/week to 0 to 1 days/week per client report for at least 3 consecutive months. Verbally express understanding of the relationship between feelings of depression, anxiety and their impact on thinking patterns and behaviors. Verbalize an understanding of the role that distorted thinking plays in creating fears, excessive worry, and ruminations.    [name] participated in the creation of the treatment plan)   [name], LCSW

## 2023-11-07 ENCOUNTER — Other Ambulatory Visit: Payer: Self-pay | Admitting: *Deleted

## 2023-11-07 DIAGNOSIS — E785 Hyperlipidemia, unspecified: Secondary | ICD-10-CM

## 2023-11-12 ENCOUNTER — Ambulatory Visit: Payer: Medicaid Other | Admitting: Psychology

## 2023-11-12 ENCOUNTER — Encounter (INDEPENDENT_AMBULATORY_CARE_PROVIDER_SITE_OTHER): Payer: Self-pay | Admitting: Family Medicine

## 2023-11-12 DIAGNOSIS — Z87442 Personal history of urinary calculi: Secondary | ICD-10-CM | POA: Diagnosis not present

## 2023-11-12 DIAGNOSIS — F900 Attention-deficit hyperactivity disorder, predominantly inattentive type: Secondary | ICD-10-CM

## 2023-11-12 DIAGNOSIS — F411 Generalized anxiety disorder: Secondary | ICD-10-CM

## 2023-11-12 MED ORDER — TAMSULOSIN HCL 0.4 MG PO CAPS: 0.4000 mg | ORAL_CAPSULE | Freq: Every day | ORAL | 0 refills | Status: DC

## 2023-11-12 NOTE — Progress Notes (Signed)
 [facility] Counselor/Therapist Progress Note  Patient ID: Jeremy Peterson, MRN: [id number]   Date: 11/12/23  Time Spent: 10:03  am - 10:50 am : 47 Minutes  Treatment Type: Individual Therapy.  Reported Symptoms: Anxiety and depression.   Mental Status Exam: Appearance:  Casual     Behavior: Appropriate  Motor: Normal  Speech/Language:  Clear and Coherent  Affect: Appropriate and Congruent  Mood: dysthymic  Thought process: normal  Thought content:   WNL  Sensory/Perceptual disturbances:   WNL  Orientation: oriented to person, place, time/date, and situation  Attention: Good  Concentration: Good  Memory: WNL  Fund of knowledge:  Good  Insight:   Good  Judgment:  Good  Impulse Control: Good    Risk Assessment: Danger to Self:  No Self-injurious Behavior: No Danger to Others: No Duty to Warn:no Physical Aggression / Violence:No  Access to Firearms a concern: No  Gang Involvement:No   In case of a mental health emergency:  55 - confidential suicide hotline. [facility]):        [address], [place] [address]       [phone] 3.   911  4.   Visiting Nearest ED.    Subjective:   Jeremy Peterson participated from home, via video and consented to treatment. Therapist participated from home office. Jeremy Peterson noted having a psychiatric appointment on April 2nd, 2025 but noted a need to confirm that this appointment is with a psychiatric provider and not a therapist. He will work on confirm this after the session. He noted having a double kidney stone and noted being in pain. He is slated to have this addressed. He noted his need to reconcile past actions and events. He noted his hope to work on these issues. We worked on identifying possible triggers. Jeremy Peterson's triggers include external stimulation, yelling, raised voices, noise, rumination. We discussed the importance of discussing these triggers with wife. He  endorsed rumination on a consistent basis. Therapist provided psycho-education regarding rumination. Therapist provided handout, via email, for reference and review regarding developing worry time. Therapist provided psych-education. Therapist modeled this during the session and we discussed this thoroughly. Jeremy Peterson was engaged and motivated during the session. He expressed commitment towards goals. Therapist praised Jeremy Peterson and provided supportive therapy. A follow-up was scheduled for continued treatment. We will work on challenging negative thoughts and feelings during our follow-up session.   Interventions: Psycho-education & CBT  Diagnosis:  Generalized anxiety disorder  Attention deficit hyperactivity disorder (ADHD), predominantly inattentive type  Psychiatric Treatment: Yes, via PCP.    Treatment Plan:  Client Abilities/Strengths Jeremy Peterson is forthcoming and motivated for change.   Support System: Family and friends.   Client Treatment Preferences Outpatient therapy.   Client Statement of Needs Jeremy Peterson would like to maintain mood balance, resolving interpersonal conflict, processing past events, managing self-talk, mindfulness, setting boundaries, self-care, managing interpersonal stressors, work on barriers towards employment, developing sustainable day-to-day routine, address marital stressors, improve communication, manage rumination, improve self-esteem. Additional goals include maintaining safety and completed safety plan.   Treatment Level Weekly  Symptoms  Depression:    (Status: maintained) Anxiety:    (Status: maintained)  Goals:   Jeremy Peterson experiences symptoms of depression and anxiety.  Treatment plan signed and available on s-drive:  No, pending signature.    Target Date: 11/05/24 Frequency: Weekly  Progress: 0 Modality: individual    Therapist will provide referrals for additional resources as appropriate.  Therapist will provide  psycho-education regarding  Jeremy Peterson's diagnosis and corresponding treatment approaches and interventions. Jeremy Peterson, Jeremy Peterson will support the patient's ability to achieve the goals identified. will employ CBT, BA, Problem-solving, Solution Focused, Mindfulness,  coping skills, & other evidenced-based practices will be used to promote progress towards healthy functioning to help manage decrease symptoms associated with his diagnosis.   Reduce overall level, frequency, and intensity of the feelings of depression, anxiety and panic evidenced by decreased overall symptoms from 6 to 7 days/week to 0 to 1 days/week per client report for at least 3 consecutive months. Verbally express understanding of the relationship between feelings of depression, anxiety and their impact on thinking patterns and behaviors. Verbalize an understanding of the role that distorted thinking plays in creating fears, excessive worry, and ruminations.    Jeremy Peterson participated in the creation of the treatment plan)   Jeremy Peterson, Jeremy Peterson

## 2023-11-12 NOTE — Telephone Encounter (Signed)

## 2023-11-13 NOTE — Addendum Note (Signed)
 Addended by: Margo Common on: 11/13/2023 04:51 PM   Modules accepted: Orders

## 2023-11-18 ENCOUNTER — Ambulatory Visit: Payer: Self-pay | Admitting: Family Medicine

## 2023-11-18 NOTE — Telephone Encounter (Signed)
 Copied from CRM [id number]. Topic: Clinical - Red Word Triage >> Nov 18, 2023  9:14 AM Desma Mcgregor wrote: Red Word that prompted transfer to Nurse Triage: Possible kidney stones not passing and been stuck for a week. Pt says he is miserable and pain has been a constant 7/10 for pain. He is awaiting imaging that was ordered, but he is not sure what to do at this time.   Chief Complaint: Possible kidney stone Symptoms: Pain, burning, blood in urine, urine stream impedment Frequency: x 1 week Pertinent Negatives: Patient denies nausea, vomiting Disposition: [x] ED /[] Urgent Care (no appt availability in office) / [] Appointment(In office/virtual)/ []  [facility]/ [] Home Care/ [] Refused Recommended Disposition /[] [facility]/ []  Follow-up with PCP Additional Notes: Patient called and advised that he is having a lot of pain with a possible kidney stone that he said feels like has been stuck in his urethra for a week now. He has had a history of kidney stones that he has been able to pass but this one will not pass. Patient states he has been taking Flomax but he still cannot pass this stone. Patient states his urine stream is impeded and he has to position himself to be able to urinate. Then he states that the urine stream is impeded, urinating takes a while, and urine drips out for a while after going to the bathroom. He had sent his PCP a message through MyChart about this and a Xray was going to be ordered but patient hasn't heard back about that xray and now his pain is a lot worse and causing issues. Patient sounds very uncomfortable during triage and in pain.  He states that he isn't sure if he has had any fevers but he has had episodes where he gets hot and sweats when the pain increases. He does say that the pain is severe when he is trying to urinate.  Pain has been constant with severe episodes of pain. Patient is advised that with the constant moderate pain lasting more than 4  hours it is recommended that the patient is seen in the next four hours by a provider.  There are no available openings in the patient's PCP office and patient is advised that if this has been going on for over a week now, with severe bouts of pain, decreased urine stream, and he can feel what appears to be the stone stuck in his urethra for a week now with pain, blood in urine, and some swelling in the area--it would be a good idea to go to the ER at this time to be further evaluated and get his pain under control.  Patient is agreeable with this and states that is what he is going to end up doing. He is advised to call us back if he has anymore questions.  He verbalized understanding.  Reason for Disposition . [1] MODERATE pain (e.g., interferes with normal activities) AND [2] constant AND [3] lasting longer than 4 hours AND [4] NOT improved with pain medicine  Answer Assessment - Initial Assessment Questions 1. MAIN CONCERN OR SYMPTOM:  "What is your main concern right now?" "What question do you have?" "What's the main symptom you're worried about?" (e.g., blood in urine, flank pain)     Pain from  2. ONSET: "When did the  pain  start?"     pain 3. BETTER-SAME-WORSE: "Are you getting better, staying the same, or getting worse compared to how you felt at your last visit  to the doctor (most recent medical visit)?"     Not getting better 4. VISIT DATE: "When were you seen?" (Date)     Mychart message 3/11 5. VISIT DOCTOR: "What is the name of the doctor (or NP/PA) taking care of you now?"     Dr Arva Chafe 6. VISIT DIAGNOSIS:  "What was the main symptom or problem that you were seen for?" "Were you given a diagnosis?"      Talked to pcp through messager 7. TREATMENT: "Did you have any treatment for your kidney stone?" (e.g., none, doctor exam, lithotripsy, medicines, stent) If Yes, ask: "When did you have this treatment?"     Xray ordered  pt usually passes them 8. NEXT APPOINTMENT: "Do  you have a follow-up appointment with your doctor?"     Xray ordered but nobody has called 9. PAIN: "Is there any pain?" If Yes, ask: "How bad is it?"  (Scale 0-10; or mild, moderate, severe)    - NONE (0): no pain    - MILD (1-3): doesn't interfere with normal activities     - MODERATE (4-7): interferes with normal activities or awakens from sleep     - SEVERE (8-10): excruciating pain, unable to do any normal activities     At least 5  10. FEVER: "Do you have a fever?" If Yes, ask: "What is it, how was it measured, and when did it start?"       unknown 11. OTHER SYMPTOMS: "Do you have any other symptoms?" (e.g., abdomen pain, blood in urine, vomiting)        Blood in urine, pain, swelling  Protocols used: Kidney Stone Follow-up Call-A-[facility]

## 2023-11-18 NOTE — Telephone Encounter (Signed)
FYI. Pt going to ED.  

## 2023-11-19 ENCOUNTER — Encounter (HOSPITAL_BASED_OUTPATIENT_CLINIC_OR_DEPARTMENT_OTHER): Payer: Self-pay | Admitting: Emergency Medicine

## 2023-11-19 ENCOUNTER — Other Ambulatory Visit: Payer: Self-pay

## 2023-11-19 ENCOUNTER — Emergency Department (HOSPITAL_BASED_OUTPATIENT_CLINIC_OR_DEPARTMENT_OTHER)

## 2023-11-19 DIAGNOSIS — R590 Localized enlarged lymph nodes: Secondary | ICD-10-CM | POA: Insufficient documentation

## 2023-11-19 DIAGNOSIS — N50812 Left testicular pain: Secondary | ICD-10-CM | POA: Diagnosis present

## 2023-11-19 DIAGNOSIS — N433 Hydrocele, unspecified: Secondary | ICD-10-CM | POA: Diagnosis not present

## 2023-11-19 DIAGNOSIS — N503 Cyst of epididymis: Secondary | ICD-10-CM | POA: Insufficient documentation

## 2023-11-19 LAB — URINALYSIS, ROUTINE W REFLEX MICROSCOPIC
Bilirubin Urine: NEGATIVE
Glucose, UA: NEGATIVE mg/dL
Hgb urine dipstick: NEGATIVE
Ketones, ur: NEGATIVE mg/dL
Leukocytes,Ua: NEGATIVE
Nitrite: NEGATIVE
Protein, ur: NEGATIVE mg/dL
Specific Gravity, Urine: 1.01 (ref 1.005–1.030)
pH: 7 (ref 5.0–8.0)

## 2023-11-19 MED ORDER — OXYCODONE-ACETAMINOPHEN 5-325 MG PO TABS
1.0000 | ORAL_TABLET | ORAL | Status: DC | PRN
  Administered 2023-11-19: 1 via ORAL
  Filled 2023-11-19: qty 1

## 2023-11-19 NOTE — ED Triage Notes (Signed)
 Right flank pain started about 9 days ago, now pain in groin area, Hx of same, contacted PCP and given RX and extra fluids.

## 2023-11-20 ENCOUNTER — Emergency Department (HOSPITAL_BASED_OUTPATIENT_CLINIC_OR_DEPARTMENT_OTHER)
Admission: EM | Admit: 2023-11-20 | Discharge: 2023-11-20 | Disposition: A | Discharge status: Home / Self Care | Attending: Emergency Medicine | Admitting: Emergency Medicine

## 2023-11-20 ENCOUNTER — Emergency Department (HOSPITAL_BASED_OUTPATIENT_CLINIC_OR_DEPARTMENT_OTHER)

## 2023-11-20 ENCOUNTER — Ambulatory Visit: Payer: Medicaid Other | Admitting: Psychology

## 2023-11-20 DIAGNOSIS — R591 Generalized enlarged lymph nodes: Secondary | ICD-10-CM

## 2023-11-20 DIAGNOSIS — F900 Attention-deficit hyperactivity disorder, predominantly inattentive type: Secondary | ICD-10-CM | POA: Diagnosis not present

## 2023-11-20 DIAGNOSIS — F411 Generalized anxiety disorder: Secondary | ICD-10-CM

## 2023-11-20 DIAGNOSIS — N50812 Left testicular pain: Secondary | ICD-10-CM

## 2023-11-20 LAB — CBC WITH DIFFERENTIAL/PLATELET
Abs Immature Granulocytes: 0.03 10*3/uL (ref 0.00–0.07)
Basophils Absolute: 0 10*3/uL (ref 0.0–0.1)
Basophils Relative: 0 %
Eosinophils Absolute: 0.2 10*3/uL (ref 0.0–0.5)
Eosinophils Relative: 2 %
HCT: 44.4 % (ref 39.0–52.0)
Hemoglobin: 15.5 g/dL (ref 13.0–17.0)
Immature Granulocytes: 0 %
Lymphocytes Relative: 36 %
Lymphs Abs: 3.3 10*3/uL (ref 0.7–4.0)
MCH: 29.4 pg (ref 26.0–34.0)
MCHC: 34.9 g/dL (ref 30.0–36.0)
MCV: 84.1 fL (ref 80.0–100.0)
Monocytes Absolute: 0.5 10*3/uL (ref 0.1–1.0)
Monocytes Relative: 5 %
Neutro Abs: 5.3 10*3/uL (ref 1.7–7.7)
Neutrophils Relative %: 57 %
Platelets: 242 10*3/uL (ref 150–400)
RBC: 5.28 MIL/uL (ref 4.22–5.81)
RDW: 12.6 % (ref 11.5–15.5)
WBC: 9.3 10*3/uL (ref 4.0–10.5)
nRBC: 0 % (ref 0.0–0.2)

## 2023-11-20 LAB — COMPREHENSIVE METABOLIC PANEL
ALT: 37 U/L (ref 0–44)
AST: 24 U/L (ref 15–41)
Albumin: 4.7 g/dL (ref 3.5–5.0)
Alkaline Phosphatase: 42 U/L (ref 38–126)
Anion gap: 11 (ref 5–15)
BUN: 8 mg/dL (ref 6–20)
CO2: 24 mmol/L (ref 22–32)
Calcium: 9.2 mg/dL (ref 8.9–10.3)
Chloride: 101 mmol/L (ref 98–111)
Creatinine, Ser: 0.89 mg/dL (ref 0.61–1.24)
GFR, Estimated: >60 mL/min (ref >60)
Glucose, Bld: 85 mg/dL (ref 70–99)
Potassium: 3.6 mmol/L (ref 3.5–5.1)
Sodium: 136 mmol/L (ref 135–145)
Total Bilirubin: 0.8 mg/dL (ref 0.0–1.2)
Total Protein: 7.6 g/dL (ref 6.5–8.1)

## 2023-11-20 LAB — LIPASE, BLOOD: Lipase: 28 U/L (ref 11–51)

## 2023-11-20 MED ORDER — OXYCODONE-ACETAMINOPHEN 5-325 MG PO TABS
1.0000 | ORAL_TABLET | Freq: Once | ORAL | Status: AC
  Administered 2023-11-20: 1 via ORAL
  Filled 2023-11-20: qty 1

## 2023-11-20 MED ORDER — SULFAMETHOXAZOLE-TRIMETHOPRIM 800-160 MG PO TABS: 1.0000 | ORAL_TABLET | Freq: Two times a day (BID) | ORAL | 0 refills | Status: DC

## 2023-11-20 NOTE — Progress Notes (Signed)
 [facility] Counselor/Therapist Progress Note  Patient ID: DARA CAMARGO, MRN: [id number]   Date: 11/20/23  Time Spent: 11:03  am - 11:58 am : 55 Minutes  Treatment Type: Individual Therapy.  Reported Symptoms: Anxiety and depression.   Mental Status Exam: Appearance:  Casual     Behavior: Appropriate  Motor: Normal  Speech/Language:  Clear and Coherent  Affect: Appropriate and Congruent  Mood: dysthymic  Thought process: normal  Thought content:   WNL  Sensory/Perceptual disturbances:   WNL  Orientation: oriented to person, place, time/date, and situation  Attention: Good  Concentration: Good  Memory: WNL  Fund of knowledge:  Good  Insight:   Good  Judgment:  Good  Impulse Control: Good    Risk Assessment: Danger to Self:  No Self-injurious Behavior: No Danger to Others: No Duty to Warn:no Physical Aggression / Violence:No  Access to Firearms a concern: No  Gang Involvement:No   In case of a mental health emergency:  17 - confidential suicide hotline. [facility]        [address], [place] [address]       [phone] 3.   911  4.   Visiting Nearest ED.    Subjective:   Jeremy Peterson participated from home, via video and consented to treatment. Therapist participated from home office. Jeremy Peterson noted recently going to the ER visit due to his kidney stone. He noted the pain being a significant barrier to functioning well and getting tasks done. He noted beng slated to follow-up with his doctor. He noted negative feelings regarding not being productive. We worked on processing this and his self-talk. He noted feelings of guilt and noted recalling past health issues and the guilt associated with not getting tasks done. He noted often not feeling good, at home, and "letting his wife down". Therapist highlighted emotional reasoning, jumps to conclusions, and generalizing. Therapist aided in challenging  this during the session and provided psycho-education regarding cognitive distortions. Therapist highlighted negative self-talk, during the session, and we worked on exploring this. He noted in the past that he "neglected his health and toughing through it". He noted a need for balance in this area going forward. We will work on exploring this during the session. Jeremy Peterson's negative self-talk includes "I am in debt, I don't have surplus of time" in relation to investment in the home vs work and feeling like he let his family down. We worked on exploring this. He noted his wife being in support of him taking better care of self. We used this to challenge his distortions and negative-solution. Therapist encouraged mindfulness in his area, focusing on challenging negative self-talk, thoughts and feelings, and to set reasonable expectations. Therapist provided handout for challenging negative thoughts and feelings, via email, and therapist modeled this during the session. We will continue to explore his expectations for self, going forward. Jeremy Peterson was engaged and motivated during the session and expressed commitment towards our goals. Therapist praised Jeremy Peterson and provided supportive therapy. A follow-up was scheduled for continued treatment, which Jeremy Peterson benefits from.   Interventions: Psycho-education & CBT  Diagnosis:  Generalized anxiety disorder  Attention deficit hyperactivity disorder (ADHD), predominantly inattentive type  Psychiatric Treatment: Yes, via PCP.    Treatment Plan:  Client Abilities/Strengths Jeremy Peterson is forthcoming and motivated for change.   Support System: Family and friends.   Client Treatment Preferences Outpatient therapy.   Client Statement of Needs Jeremy Peterson would like to maintain mood  balance, resolving interpersonal conflict, processing past events, managing self-talk, mindfulness, setting boundaries, self-care, managing interpersonal stressors, work on barriers towards  employment, developing sustainable day-to-day routine, address marital stressors, improve communication, manage rumination, improve self-esteem. Additional goals include maintaining safety and completed safety plan.   Treatment Level Weekly  Symptoms  Depression:   loss of interest, feeling down, poor sleep, lethargy, poor appetite, feeling bad about self, difficulty concentrating, psychomotor retardation, previous SI (not current). (Status: maintained) Anxiety:   feeling anxious, difficulty managing worry, worrying about different things, trouble relaxing, restlessness, irritability, and feeling afraid something awful might happen.  (Status: maintained)  Goals:   Shubh experiences symptoms of depression and anxiety.  Treatment plan signed and available on s-drive:  No, pending signature.    Target Date: 11/05/24 Frequency: Weekly  Progress: 0 Modality: individual    Therapist will provide referrals for additional resources as appropriate.  Therapist will provide psycho-education regarding Jeremy Peterson diagnosis and corresponding treatment approaches and interventions. [name], LCSW will support the patient's ability to achieve the goals identified. will employ CBT, BA, Problem-solving, Solution Focused, Mindfulness,  coping skills, & other evidenced-based practices will be used to promote progress towards healthy functioning to help manage decrease symptoms associated with his diagnosis.   Reduce overall level, frequency, and intensity of the feelings of depression, anxiety and panic evidenced by decreased overall symptoms from 6 to 7 days/week to 0 to 1 days/week per client report for at least 3 consecutive months. Verbally express understanding of the relationship between feelings of depression, anxiety and their impact on thinking patterns and behaviors. Verbalize an understanding of the role that distorted thinking plays in creating fears, excessive worry, and  ruminations.    Jeremy Peterson participated in the creation of the treatment plan)   [name], LCSW

## 2023-11-20 NOTE — ED Provider Notes (Signed)
 [facility] Provider Note   CSN: [id number] Arrival date & time: 11/19/23  2022     History  Chief Complaint  Patient presents with   Groin Pain    Jeremy Peterson is a 38 y.o. male.  The history is provided by the patient.  Groin Pain  Jeremy Peterson is a 38 y.o. male who presents to the Emergency Department complaining of testicular pain.  He presents to the emergency department for evaluation of 1 week of left testicular pain and pressure as well as pain in his penile shaft.  He has been having sweating episodes and feeling hot but no reported fevers.  He also has some pain on his right flank.  No penile discharge.  No new partners.  No nausea, vomiting, diarrhea.  He has no known medical problems.    Home Medications Prior to Admission medications   Medication Sig Start Date End Date Taking? Authorizing Provider  sulfamethoxazole-trimethoprim (BACTRIM DS) 800-160 MG tablet Take 1 tablet by mouth 2 (two) times daily for 14 days. 11/20/23 12/04/23 Yes Tilden Fossa, MD  citalopram (CELEXA) 20 MG tablet Take 1 tablet (20 mg total) by mouth daily. 10/22/23   Sharlene Dory, DO  gabapentin (NEURONTIN) 300 MG capsule Take 2 capsules (600 mg total) by mouth 3 (three) times daily as needed (Anxiety). 10/22/23   Sharlene Dory, DO  rosuvastatin (CRESTOR) 20 MG tablet Take 1 tablet (20 mg total) by mouth daily. 11/04/23   Sharlene Dory, DO  tamsulosin (FLOMAX) 0.4 MG CAPS capsule Take 1 capsule (0.4 mg total) by mouth daily. 11/12/23   Sharlene Dory, DO      Allergies    Patient has no known allergies.    Review of Systems   Review of Systems  All other systems reviewed and are negative.   Physical Exam Updated Vital Signs BP (!) 133/91 (BP Location: Right Arm)   Pulse (!) 57   Temp 98.8 F (37.1 C) (Oral)   Resp 20   Ht 5\' 10"  (1.778 m)   Wt 99.8 kg   SpO2 96%   BMI 31.57 kg/m  Physical  Exam Vitals and nursing note reviewed.  Constitutional:      Appearance: He is well-developed.  HENT:     Head: Normocephalic and atraumatic.  Cardiovascular:     Rate and Rhythm: Normal rate and regular rhythm.     Heart sounds: No murmur heard. Pulmonary:     Effort: Pulmonary effort is normal. No respiratory distress.     Breath sounds: Normal breath sounds.  Abdominal:     Palpations: Abdomen is soft.     Tenderness: There is no abdominal tenderness. There is no guarding or rebound.  Genitourinary:    Comments: There is some left inguinal lymphadenopathy.  No significant overlying skin changes.  No scrotal edema or erythema.  There is tenderness to palpation over the left scrotum. Musculoskeletal:        General: No tenderness.  Skin:    General: Skin is warm and dry.  Neurological:     Mental Status: He is alert and oriented to person, place, and time.  Psychiatric:        Behavior: Behavior normal.     ED Results / Procedures / Treatments   Labs (all labs ordered are listed, but only abnormal results are displayed) Labs Reviewed  URINALYSIS, ROUTINE W REFLEX MICROSCOPIC  CBC WITH DIFFERENTIAL/PLATELET  COMPREHENSIVE METABOLIC PANEL  LIPASE, BLOOD    EKG None  Radiology US SCROTUM W/DOPPLER Result Date: 11/20/2023 CLINICAL DATA:  Left groin pain. EXAM: SCROTAL ULTRASOUND DOPPLER ULTRASOUND OF THE TESTICLES TECHNIQUE: Complete ultrasound examination of the testicles, epididymis, and other scrotal structures was performed. Color and spectral Doppler ultrasound were also utilized to evaluate blood flow to the testicles. COMPARISON:  None Available. FINDINGS: Right testicle Measurements: 4.1 cm x 2.3 cm x 2.8 cm. No mass or microlithiasis visualized. Left testicle Measurements: 4.1 cm x 2.3 cm x 3.0 cm. No mass or microlithiasis visualized. A subcentimeter appendix testis is seen on the left. Right epididymis: Limited in visualization. Normal in size and appearance. A  vasectomy clip is seen near the right epididymal head. Left epididymis: A 0.7 cm x 1.3 cm x 1.3 cm left epididymal head cyst is seen. Hydrocele:  There is a small left-sided hydrocele. Varicocele:  None visualized. Pulsed Doppler interrogation of both testes demonstrates normal low resistance arterial and venous waveforms bilaterally. Other: Multiple left groin lymph nodes are seen. The largest measures approximately 2.6 cm x 1.1 cm x 1.7 cm. IMPRESSION: 1. Small left epididymal head cyst. 2. Small left hydrocele. 3. Left groin lymphadenopathy, as described above. Electronically Signed   By: Aram Candela M.D.   On: 11/20/2023 02:51   CT Renal Stone Study Result Date: 11/19/2023 CLINICAL DATA:  Right flank pain onset 9 days ago with radiation to the groin area. Prior history of retrieval of undescended testicle. Prior appendectomy and cholecystectomy. EXAM: CT ABDOMEN AND PELVIS WITHOUT CONTRAST TECHNIQUE: Multidetector CT imaging of the abdomen and pelvis was performed following the standard protocol without IV contrast. RADIATION DOSE REDUCTION: This exam was performed according to the departmental dose-optimization program which includes automated exposure control, adjustment of the mA and/or kV according to patient size and/or use of iterative reconstruction technique. COMPARISON:  CT without contrast 04/13/2009. FINDINGS: Lower chest: No abnormality. Hepatobiliary: No focal liver abnormality is seen without contrast. Status post cholecystectomy. No biliary dilatation. Pancreas: Unremarkable without contrast. Spleen: Slightly prominent, 13.5 cm coronal, unchanged. No focal abnormality without contrast. Adrenals/Urinary Tract: There is no adrenal mass, no contour deforming abnormality of the unenhanced kidneys. There is a 1 mm nonobstructing caliceal stone in the inferior pole of the left kidney. Occasional punctate nonobstructive caliceal stones in the lower pole on the right kidney. There is no ureteral  stone or hydroureteronephrosis bilaterally. No bladder thickening. Stomach/Bowel: No dilatation or wall thickening with again noted surgical absence of the appendix. There is diffuse diverticulosis without evidence of focal diverticulitis. Vascular/Lymphatic: No significant vascular findings are present. No enlarged abdominal or pelvic lymph nodes. There are bilateral mildly enlarged inguinal chain nodes, on the left the largest is 1.5 cm in short axis, on the right largest is 1.1 cm. On the left there overlying hazy changes in the subcutaneous fat which could indicate cellulitis just above the left groin crease. Also could indicate localized trauma. There is no abscess or soft tissue gas. No scrotal or perineal soft tissue reaction. Reproductive: Normal size prostate. Both testicles are in the scrotal sac. Above the level of the testicles there are surgical clips in bilateral scrotum. Other: No free air, free fluid or free hemorrhage, or incarcerated hernias. Musculoskeletal: No acute or significant osseous findings. IMPRESSION: 1. Bilateral nonobstructive micronephrolithiasis. No ureteral stone or hydroureteronephrosis bilaterally. 2. Diffuse diverticulosis without evidence of diverticulitis. 3. Hazy changes in the subcutaneous fat just above the left groin crease, which could indicate cellulitis or localized trauma. No  abscess or soft tissue gas. 4. Bilateral mildly enlarged inguinal chain nodes, left greater than right. 5. Slightly prominent spleen, unchanged. 6. Prior cholecystectomy, appendectomy, and bilateral scrotal surgery. Electronically Signed   By: Almira Bar M.D.   On: 11/19/2023 22:33    Procedures Procedures    Medications Ordered in ED Medications  oxyCODONE-acetaminophen (PERCOCET/ROXICET) 5-325 MG per tablet 1 tablet (1 tablet Oral Given 11/19/23 2108)  oxyCODONE-acetaminophen (PERCOCET/ROXICET) 5-325 MG per tablet 1 tablet (1 tablet Oral Given 11/20/23 0226)    ED Course/ Medical  Decision Making/ A&P                                 Medical Decision Making Amount and/or Complexity of Data Reviewed Labs: ordered. Radiology: ordered.  Risk Prescription drug management.   Patient here for evaluation of left testicular pain.  CT does demonstrate some changes concerning for cellulitis as well as lymphadenopathy.  No overlying skin findings that are consistent with cellulitis.  Testicular ultrasound does demonstrate an epididymal cyst, hydrocele.  No clear source for patient's symptoms.  Labs are reassuring.  Given his degree of pain, lymphadenopathy will start on antibiotics for possible prostatitis.  Discussed importance of outpatient follow-up as well as return precautions.        Final Clinical Impression(s) / ED Diagnoses Final diagnoses:  Lymphadenopathy  Testicular pain, left    Rx / DC Orders ED Discharge Orders          Ordered    sulfamethoxazole-trimethoprim (BACTRIM DS) 800-160 MG tablet  2 times daily        11/20/23 0339              Tilden Fossa, MD 11/20/23 [phone]

## 2023-11-22 ENCOUNTER — Emergency Department (HOSPITAL_BASED_OUTPATIENT_CLINIC_OR_DEPARTMENT_OTHER)

## 2023-11-22 ENCOUNTER — Emergency Department (HOSPITAL_BASED_OUTPATIENT_CLINIC_OR_DEPARTMENT_OTHER)
Admission: EM | Admit: 2023-11-22 | Discharge: 2023-11-22 | Disposition: A | Discharge status: Home / Self Care | Attending: Emergency Medicine | Admitting: Emergency Medicine

## 2023-11-22 ENCOUNTER — Encounter (HOSPITAL_BASED_OUTPATIENT_CLINIC_OR_DEPARTMENT_OTHER): Payer: Self-pay

## 2023-11-22 ENCOUNTER — Other Ambulatory Visit: Payer: Self-pay

## 2023-11-22 DIAGNOSIS — R109 Unspecified abdominal pain: Secondary | ICD-10-CM | POA: Diagnosis not present

## 2023-11-22 DIAGNOSIS — M791 Myalgia, unspecified site: Secondary | ICD-10-CM | POA: Insufficient documentation

## 2023-11-22 DIAGNOSIS — R112 Nausea with vomiting, unspecified: Secondary | ICD-10-CM | POA: Diagnosis present

## 2023-11-22 DIAGNOSIS — R519 Headache, unspecified: Secondary | ICD-10-CM | POA: Insufficient documentation

## 2023-11-22 LAB — CBC WITH DIFFERENTIAL/PLATELET
Abs Immature Granulocytes: 0.05 10*3/uL (ref 0.00–0.07)
Basophils Absolute: 0 10*3/uL (ref 0.0–0.1)
Basophils Relative: 0 %
Eosinophils Absolute: 0.1 10*3/uL (ref 0.0–0.5)
Eosinophils Relative: 0 %
HCT: 47.3 % (ref 39.0–52.0)
Hemoglobin: 16.9 g/dL (ref 13.0–17.0)
Immature Granulocytes: 0 %
Lymphocytes Relative: 4 %
Lymphs Abs: 0.5 10*3/uL — ABNORMAL LOW (ref 0.7–4.0)
MCH: 29.5 pg (ref 26.0–34.0)
MCHC: 35.7 g/dL (ref 30.0–36.0)
MCV: 82.5 fL (ref 80.0–100.0)
Monocytes Absolute: 0.5 10*3/uL (ref 0.1–1.0)
Monocytes Relative: 4 %
Neutro Abs: 11.4 10*3/uL — ABNORMAL HIGH (ref 1.7–7.7)
Neutrophils Relative %: 92 %
Platelets: 213 10*3/uL (ref 150–400)
RBC: 5.73 MIL/uL (ref 4.22–5.81)
RDW: 12.5 % (ref 11.5–15.5)
WBC: 12.5 10*3/uL — ABNORMAL HIGH (ref 4.0–10.5)
nRBC: 0 % (ref 0.0–0.2)

## 2023-11-22 LAB — URINALYSIS, ROUTINE W REFLEX MICROSCOPIC
Bilirubin Urine: NEGATIVE
Glucose, UA: NEGATIVE mg/dL
Hgb urine dipstick: NEGATIVE
Ketones, ur: 80 mg/dL — AB
Leukocytes,Ua: NEGATIVE
Nitrite: NEGATIVE
Protein, ur: NEGATIVE mg/dL
Specific Gravity, Urine: 1.015 (ref 1.005–1.030)
pH: 6.5 (ref 5.0–8.0)

## 2023-11-22 LAB — COMPREHENSIVE METABOLIC PANEL
ALT: 40 U/L (ref 0–44)
AST: 26 U/L (ref 15–41)
Albumin: 4.7 g/dL (ref 3.5–5.0)
Alkaline Phosphatase: 44 U/L (ref 38–126)
Anion gap: 13 (ref 5–15)
BUN: 17 mg/dL (ref 6–20)
CO2: 23 mmol/L (ref 22–32)
Calcium: 9.3 mg/dL (ref 8.9–10.3)
Chloride: 99 mmol/L (ref 98–111)
Creatinine, Ser: 1.3 mg/dL — ABNORMAL HIGH (ref 0.61–1.24)
GFR, Estimated: >60 mL/min (ref >60)
Glucose, Bld: 117 mg/dL — ABNORMAL HIGH (ref 70–99)
Potassium: 3.9 mmol/L (ref 3.5–5.1)
Sodium: 135 mmol/L (ref 135–145)
Total Bilirubin: 1.5 mg/dL — ABNORMAL HIGH (ref 0.0–1.2)
Total Protein: 8 g/dL (ref 6.5–8.1)

## 2023-11-22 LAB — RESP PANEL BY RT-PCR (RSV, FLU A&B, COVID)  RVPGX2
Influenza A by PCR: NEGATIVE
Influenza B by PCR: NEGATIVE
Resp Syncytial Virus by PCR: NEGATIVE
SARS Coronavirus 2 by RT PCR: NEGATIVE

## 2023-11-22 LAB — TROPONIN I (HIGH SENSITIVITY)
Troponin I (High Sensitivity): <2 ng/L (ref <18)
Troponin I (High Sensitivity): <2 ng/L (ref <18)

## 2023-11-22 LAB — CK: Total CK: 117 U/L (ref 49–397)

## 2023-11-22 MED ORDER — SODIUM CHLORIDE 0.9 % IV BOLUS
1000.0000 mL | Freq: Once | INTRAVENOUS | Status: AC
  Administered 2023-11-22: 1000 mL via INTRAVENOUS

## 2023-11-22 MED ORDER — SODIUM CHLORIDE 0.9 % IV SOLN
12.5000 mg | Freq: Once | INTRAVENOUS | Status: AC
  Administered 2023-11-22: 12.5 mg via INTRAVENOUS
  Filled 2023-11-22: qty 0.5

## 2023-11-22 MED ORDER — IOHEXOL 350 MG/ML SOLN
75.0000 mL | Freq: Once | INTRAVENOUS | Status: AC | PRN
  Administered 2023-11-22: 75 mL via INTRAVENOUS

## 2023-11-22 MED ORDER — ONDANSETRON 4 MG PO TBDP: 4.0000 mg | ORAL_TABLET | Freq: Three times a day (TID) | ORAL | 0 refills | Status: DC | PRN

## 2023-11-22 MED ORDER — ONDANSETRON HCL 4 MG/2ML IJ SOLN
4.0000 mg | Freq: Once | INTRAMUSCULAR | Status: AC
  Administered 2023-11-22: 4 mg via INTRAVENOUS
  Filled 2023-11-22: qty 2

## 2023-11-22 MED ORDER — DOXYCYCLINE HYCLATE 100 MG PO CAPS: 100.0000 mg | ORAL_CAPSULE | Freq: Two times a day (BID) | ORAL | 0 refills | Status: DC

## 2023-11-22 MED ORDER — IOHEXOL 300 MG/ML  SOLN
100.0000 mL | Freq: Once | INTRAMUSCULAR | Status: AC | PRN
Start: 2023-11-22 — End: 2023-11-22
  Administered 2023-11-22: 100 mL via INTRAVENOUS

## 2023-11-22 MED ORDER — PROMETHAZINE HCL 25 MG/ML IJ SOLN
INTRAMUSCULAR | Status: AC
  Filled 2023-11-22: qty 1

## 2023-11-22 NOTE — ED Triage Notes (Signed)
 Pt was started on bactrim 2 days ago, states since he started the medication his nausea has been building since then, he has no hives, no mouth or lip swelling. He mentions he has not been able to keep any fluids down at all as well. He mentions leg cramping, headache, and lethargic.

## 2023-11-22 NOTE — ED Provider Notes (Addendum)
 [facility] Provider Note   CSN: [id number] Arrival date & time: 11/22/23  0208     History  Chief Complaint  Patient presents with   Medication Reaction    Jeremy Peterson is a 38 y.o. male.  Patient was seen 2 nights ago and treated for possible prostatitis with Bactrim.  He states since starting this medication he has been having severe nausea and over the past 6 hours has had intractable vomiting and nausea with abdominal cramping and headache.  States he is concerned this could be reaction to the Bactrim.  Denies any tongue swelling, lip swelling, chest pain, shortness of breath, difficulty breathing.  Has diffuse crampy abdominal pain and cannot keep anything down at home.  Emesis has been nonbloody.  No diarrhea.  Chills but no documented fever.  No cough, runny nose or sore throat.  Also has had a severe headache progressively worsening since approximately 6 PM.  believe the headache came on rather suddenly.  Was gotten substantially worse since the first onset.  No focal weakness, numbness or tingling.  No history of similar reactions.  Having bodyaches and cramps all over.  Feels fatigued and nauseous and very uncomfortable.  The history is provided by the patient.       Home Medications Prior to Admission medications   Medication Sig Start Date End Date Taking? Authorizing Provider  citalopram (CELEXA) 20 MG tablet Take 1 tablet (20 mg total) by mouth daily. 10/22/23   Sharlene Dory, DO  gabapentin (NEURONTIN) 300 MG capsule Take 2 capsules (600 mg total) by mouth 3 (three) times daily as needed (Anxiety). 10/22/23   Sharlene Dory, DO  rosuvastatin (CRESTOR) 20 MG tablet Take 1 tablet (20 mg total) by mouth daily. 11/04/23   Sharlene Dory, DO  sulfamethoxazole-trimethoprim (BACTRIM DS) 800-160 MG tablet Take 1 tablet by mouth 2 (two) times daily for 14 days. 11/20/23 12/04/23  Tilden Fossa, MD   tamsulosin (FLOMAX) 0.4 MG CAPS capsule Take 1 capsule (0.4 mg total) by mouth daily. 11/12/23   Sharlene Dory, DO      Allergies    Patient has no known allergies.    Review of Systems   Review of Systems  Constitutional:  Positive for activity change, appetite change and fatigue. Negative for fever.  HENT:  Negative for congestion and rhinorrhea.   Respiratory:  Negative for cough, chest tightness and shortness of breath.   Cardiovascular:  Negative for chest pain.  Gastrointestinal:  Positive for abdominal pain, nausea and vomiting.  Genitourinary:  Negative for dysuria and hematuria.  Musculoskeletal:  Positive for arthralgias and myalgias.  Skin:  Negative for rash.  Neurological:  Positive for dizziness, weakness, light-headedness and headaches.   all other systems are negative except as noted in the HPI and PMH.    Physical Exam Updated Vital Signs BP 137/86   Pulse 94   Temp 97.6 F (36.4 C)   Resp 18   SpO2 99%  Physical Exam Vitals and nursing note reviewed.  Constitutional:      General: He is not in acute distress.    Appearance: He is well-developed.     Comments: Uncomfortable  HENT:     Head: Normocephalic and atraumatic.     Mouth/Throat:     Mouth: Mucous membranes are dry.     Pharynx: No oropharyngeal exudate.  Eyes:     Conjunctiva/sclera: Conjunctivae normal.     Pupils: Pupils are  equal, round, and reactive to light.  Neck:     Comments: No meningismus. Cardiovascular:     Rate and Rhythm: Normal rate and regular rhythm.     Heart sounds: Normal heart sounds. No murmur heard. Pulmonary:     Effort: Pulmonary effort is normal. No respiratory distress.     Breath sounds: Normal breath sounds.  Abdominal:     Palpations: Abdomen is soft.     Tenderness: There is abdominal tenderness. There is no guarding or rebound.  Genitourinary:    Comments: Left inguinal lymphadenopathy, no overlying erythema or rash Musculoskeletal:         General: No tenderness. Normal range of motion.     Cervical back: Normal range of motion and neck supple.  Skin:    General: Skin is warm.  Neurological:     Mental Status: He is alert and oriented to person, place, and time.     Cranial Nerves: No cranial nerve deficit.     Motor: No abnormal muscle tone.     Coordination: Coordination normal.     Comments:  5/5 strength throughout. CN 2-12 intact.Equal grip strength.   Psychiatric:        Behavior: Behavior normal.     ED Results / Procedures / Treatments   Labs (all labs ordered are listed, but only abnormal results are displayed) Labs Reviewed  CBC WITH DIFFERENTIAL/PLATELET - Abnormal; Notable for the following components:      Result Value   WBC 12.5 (*)    Neutro Abs 11.4 (*)    Lymphs Abs 0.5 (*)    All other components within normal limits  COMPREHENSIVE METABOLIC PANEL - Abnormal; Notable for the following components:   Glucose, Bld 117 (*)    Creatinine, Ser 1.30 (*)    Total Bilirubin 1.5 (*)    All other components within normal limits  URINALYSIS, ROUTINE W REFLEX MICROSCOPIC - Abnormal; Notable for the following components:   Ketones, ur 80 (*)    All other components within normal limits  RESP PANEL BY RT-PCR (RSV, FLU A&B, COVID)  RVPGX2  CK  TROPONIN I (HIGH SENSITIVITY)  TROPONIN I (HIGH SENSITIVITY)    EKG None  Radiology CT ABDOMEN PELVIS W CONTRAST Result Date: 11/22/2023 CLINICAL DATA:  Abdominal pain with increasing nausea since starting a Bactrim course 2 days ago. Prior appendectomy, cholecystectomy and vasectomy. History of kidney stones. EXAM: CT ABDOMEN AND PELVIS WITH CONTRAST TECHNIQUE: Multidetector CT imaging of the abdomen and pelvis was performed using the standard protocol following bolus administration of intravenous contrast. RADIATION DOSE REDUCTION: This exam was performed according to the departmental dose-optimization program which includes automated exposure control, adjustment  of the mA and/or kV according to patient size and/or use of iterative reconstruction technique. CONTRAST:  OMNIPAQUE IOHEXOL 300 MG/ML  SOLN COMPARISON:  CTs without contrast 11/19/2023 and 04/13/2009. FINDINGS: Lower chest: There is mild elevation of the right hemidiaphragm. Increased posterior linear atelectasis. Lung bases are clear of infiltrate. The cardiac size is normal. Hepatobiliary: No focal liver abnormality is seen. Status post cholecystectomy. No biliary dilatation. 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 No mass. No ductal dilatation. Spleen: 14.8 cm AP, mildly prominent.  No mass. Adrenals/Urinary Tract: There is no adrenal or renal mass enhancement. There is contrast in the bilateral collecting systems. No hydronephrosis is seen. There is contrast in the bladder with unremarkable bladder. The recent prior study demonstrated a single 1 mm stone in the inferior pole of the left  kidney and occasional punctate stones in the inferior pole on the right. The collecting system contrast obscures visualization of any pre-existing stones. Stomach/Bowel: Interval increased nondilated fluid filling of multiple mid to lower small bowel segments suggesting nonspecific enteritis. There is mild mucosal enhancement of the fluid-filled segments. There is no small bowel obstruction or mesenteric inflammatory change. Surgically absent appendix is again noted. Mild fecal stasis. There is diverticulosis without evidence of diverticulitis. Vascular/Lymphatic: No significant vascular findings are present. No enlarged abdominal or pelvic lymph nodes. Enlarged left inguinal chain nodes up to 1.4 cm short axis are again noted. Haziness in the overlying subcutaneous fat is still seen but improved. Reproductive: Prostate is unremarkable. Other: None. Musculoskeletal: No acute or significant osseous findings. IMPRESSION: 1. Interval increased nondilated fluid filling of multiple mid to lower small bowel  segments with mild mucosal enhancement, suggesting nonspecific enteritis. No bowel obstruction or mesenteric inflammatory change. 2. Constipation and diverticulosis. 3. Mild splenomegaly. 4. Contrast in the collecting systems and bladder obscures visualization of any pre-existing stones. No hydronephrosis. 5. Enlarged left inguinal chain nodes up to 1.4 cm short axis, with haziness in the overlying subcutaneous fat still seen but improved. Findings could be due to cellulitis. Correlate clinically for any signs of cellulitis. Electronically Signed   By: Almira Bar M.D.   On: 11/22/2023 04:58   CT ANGIO HEAD NECK W WO CM Result Date: 11/22/2023 CLINICAL DATA:  38 year old male with headache and lethargy after starting antibiotics 2 days ago. EXAM: CT ANGIOGRAPHY HEAD AND NECK WITH AND WITHOUT CONTRAST TECHNIQUE: Multidetector CT imaging of the head and neck was performed using the standard protocol during bolus administration of intravenous contrast. Multiplanar CT image reconstructions and MIPs were obtained to evaluate the vascular anatomy. Carotid stenosis measurements (when applicable) are obtained utilizing NASCET criteria, using the distal internal carotid diameter as the denominator. RADIATION DOSE REDUCTION: This exam was performed according to the departmental dose-optimization program which includes automated exposure control, adjustment of the mA and/or kV according to patient size and/or use of iterative reconstruction technique. CONTRAST:  75mL OMNIPAQUE IOHEXOL 350 MG/ML SOLN COMPARISON:  None Available. FINDINGS: CT HEAD Brain: No midline shift, ventriculomegaly, mass effect, evidence of mass lesion, intracranial hemorrhage or evidence of cortically based acute infarction. Gray-white matter differentiation is within normal limits throughout the brain. Normal cerebral volume. Calvarium and skull base: Intact, negative. Paranasal sinuses: Visualized paranasal sinuses and mastoids are clear. Orbits:  Visualized orbits and scalp soft tissues are within normal limits. CTA NECK Skeleton: No acute dental finding. No acute osseous abnormality identified. Upper chest: Upper lungs are clear. Negative visible superior mediastinum. Other neck: Neck soft tissue spaces are within normal limits. Aortic arch: Normal 3 vessel arch. Right carotid system: Normal. Left carotid system: Normal. Vertebral arteries: Proximal subclavian arteries and cervical vertebral arteries are normal, codominant. CTA HEAD Posterior circulation: Distal vertebral arteries, PICA origins, vertebrobasilar junction are normal. Patent basilar artery, SCA and PCA origins without stenosis. Right posterior communicating artery is present, the left is diminutive or absent. Bilateral PCA branches are within normal limits. Anterior circulation: Both ICA siphons are patent and normal. There is trace gas in the cavernous sinus, likely related to recent IV access. Normal ophthalmic and right posterior communicating artery origins. Patent carotid termini. Patent MCA and ACA origins. Dominant left A1, the right is diminutive. Normal anterior communicating artery. Bilateral ACA branches are within normal limits. Bilateral MCA M1 segments and bi/trifurcations are patent without stenosis. Bilateral MCA branches are within normal  limits. Venous sinuses: Patent. Anatomic variants: Dominant left ACA A1. Review of the MIP images confirms the above findings IMPRESSION: Normal CTA Head and Neck. Normal CT appearance of the brain. Electronically Signed   By: Odessa Fleming M.D.   On: 11/22/2023 04:42    Procedures Procedures    Medications Ordered in ED Medications  sodium chloride 0.9 % bolus 1,000 mL (has no administration in time range)  sodium chloride 0.9 % bolus 1,000 mL (has no administration in time range)    ED Course/ Medical Decision Making/ A&P                                 Medical Decision Making Amount and/or Complexity of Data Reviewed Labs:  ordered. Decision-making details documented in ED Course. Radiology: ordered and independent interpretation performed. Decision-making details documented in ED Course. ECG/medicine tests: ordered and independent interpretation performed. Decision-making details documented in ED Course.  Risk Prescription drug management.   Intractable nausea and vomiting with bodyaches, headache, abdominal pain.  Stable vitals.  No distress or increased work of breathing.  Clear lungs.  Suspect likely viral illness versus medication reaction to Bactrim.  Will hydrate, check labs EKG and treat symptoms  EKG shows sinus rhythm, no acute ST changes.  Low suspicion for anaphylaxis but may be adverse reaction to Bactrim versus other viral illness. Labs show mild leukocytosis of 12.  LFTs and lipase are normal.  Creatinine normal.  CK normal UA with large ketones without infection.  COVID and flu swabs are negative. Normal Anion gap.  Blood sugar 117.  Given severe rather sudden onset headache, CTA is obtained which is negative for aneurysm or vascular stenosis. Low suspicion for nonaneurysmal subarachnoid hemorrhage.  Patient does state he had a headache prior to vomiting which became gradually worse with vomiting.  CT abdomen pelvis shows evidence of enteritis without bowel obstruction. enlarged left inguinal lymph node similar to previous.  Nausea has improved.  No further vomiting.  Patient able to tolerate p.o.  Recommend stopping Bactrim.  Rate improved to the 90s.  Patient tolerating p.o. without difficulty.  Abdomen soft and nontender.  Headache has improved.  Will change antibiotic to doxycycline.  Recommended not to start this until he has fully recovered from his nausea and vomiting episodes.  Advised not to take Bactrim in the future.  Start with a clear liquid diet, advance slowly as tolerated.  Return to the ED with worsening pain, vomiting, any other concerns.       Final Clinical  Impression(s) / ED Diagnoses Final diagnoses:  Nausea and vomiting, unspecified vomiting type    Rx / DC Orders ED Discharge Orders     None         Shakoya Gilmore, Jeannett Senior, MD 11/22/23 0454    Glynn Octave, MD 11/22/23 [phone]

## 2023-11-22 NOTE — Discharge Instructions (Signed)
 Your symptoms may be due to Bactrim use.  You should stop taking this medication.  Do not take it again in the future.  Keep yourself hydrated.  Start with a clear liquid diet and advance slowly as tolerated.  Take the nausea medication as prescribed.  Advance your diet slowly.  Follow-up with your primary doctor for recheck this week.  You may try doxycycline instead of Bactrim for your lymphadenopathy. Return to the ED with new or worsening symptoms.

## 2023-11-25 ENCOUNTER — Ambulatory Visit (INDEPENDENT_AMBULATORY_CARE_PROVIDER_SITE_OTHER): Admitting: Family Medicine

## 2023-11-25 ENCOUNTER — Encounter: Payer: Self-pay | Admitting: Family Medicine

## 2023-11-25 VITALS — BP 134/74 | HR 75 | Temp 98.2°F | Ht 70.0 in | Wt 217.6 lb

## 2023-11-25 DIAGNOSIS — T50905A Adverse effect of unspecified drugs, medicaments and biological substances, initial encounter: Secondary | ICD-10-CM | POA: Diagnosis not present

## 2023-11-25 DIAGNOSIS — L732 Hidradenitis suppurativa: Secondary | ICD-10-CM | POA: Diagnosis not present

## 2023-11-25 MED ORDER — CLINDAMYCIN PHOSPHATE 1 % EX GEL: Freq: Two times a day (BID) | CUTANEOUS | 2 refills | Status: DC

## 2023-11-25 NOTE — Progress Notes (Signed)
 Chief Complaint  Patient presents with   Follow-up    Patient presents today for a emergency room visit follow-up    Jeremy Peterson is a 38 y.o. male here for a skin complaint.  Duration: 2 years Location: Armpits, groin region Pruritic? No Painful? No Drainage? Yes New soaps/lotions/topicals/detergents? No Trauma? No Other associated symptoms: No lesions will drain but never fully go away, lymph node prominence/swelling; lymph nodes do improved in size when he is on antibiotics No fevers Therapies tried thus far: none  Patient was also in the emergency department for an adverse reaction to Bactrim.  He had nausea, vomiting, diarrhea.  Steadily improving.  Past Medical History:  Diagnosis Date   Anxiety    Concussion    multiple from college wrestling, no residual   DEPRESSION    History of kidney stones    Migraines    Nephrolithiasis    PAC (premature atrial contraction)    no cardiology no treatment needed last pac few yrs ago    BP 134/74   Pulse 75   Temp 98.2 F (36.8 C)   Ht 5\' 10"  (1.778 m)   Wt 217 lb 9.6 oz (98.7 kg)   SpO2 99%   BMI 31.22 kg/m  Gen: awake, alert, appearing stated age Heart: RRR Lungs: Clear to auscultation bilaterally.  No accessory muscle use Skin: Pustules and nodularity in the axillary region bilaterally. No drainage, excessive warmth, fluctuance Abdomen: Bowel sounds present, soft, nontender, nondistended Psych: Age appropriate judgment and insight  Hidradenitis suppurativa - Plan: Ambulatory referral to General Surgery, clindamycin (CLINDAGEL) 1 % gel  Adverse effect of drug, initial encounter  Chronic, uncontrolled.  Start Clindagel twice daily.  Refer to general surgery for their evaluation.  Seems the lymph nodes are reactive to local process. Seems to be improving steadily.  No changes. F/u as originally scheduled. The patient voiced understanding and agreement to the plan.  Jilda Roche [place], DO 11/25/23 12:02  PM

## 2023-11-25 NOTE — Patient Instructions (Signed)
 If you do not hear anything about your referral in the next 1-2 weeks, call our office and ask for an update.  The GI symptoms should steadily improve the further away you get from the Bactrim.  Let us know if you need anything.

## 2023-11-26 ENCOUNTER — Ambulatory Visit (INDEPENDENT_AMBULATORY_CARE_PROVIDER_SITE_OTHER): Admitting: Psychology

## 2023-11-26 DIAGNOSIS — F900 Attention-deficit hyperactivity disorder, predominantly inattentive type: Secondary | ICD-10-CM | POA: Diagnosis not present

## 2023-11-26 DIAGNOSIS — F411 Generalized anxiety disorder: Secondary | ICD-10-CM

## 2023-11-26 NOTE — Progress Notes (Signed)
 [facility] Counselor/Therapist Progress Note  Patient ID: Jeremy Peterson, MRN: [id number]   Date: 11/26/23  Time Spent: 10:03  am - 10:53  am : 50 Minutes  Treatment Type: Individual Therapy.  Reported Symptoms: Anxiety and depression.   Mental Status Exam: Appearance:  Casual     Behavior: Appropriate  Motor: Normal  Speech/Language:  Clear and Coherent  Affect: Appropriate and Congruent  Mood: dysthymic  Thought process: normal  Thought content:   WNL  Sensory/Perceptual disturbances:   WNL  Orientation: oriented to person, place, time/date, and situation  Attention: Good  Concentration: Good  Memory: WNL  Fund of knowledge:  Good  Insight:   Good  Judgment:  Good  Impulse Control: Good    Risk Assessment: Danger to Self:  No Self-injurious Behavior: No Danger to Others: No Duty to Warn:no Physical Aggression / Violence:No  Access to Firearms a concern: No  Gang Involvement:No   In case of a mental health emergency:  80 - confidential suicide hotline. [facility]        [address], [place] [address]       [phone] 3.   911  4.   Visiting Nearest ED.    Subjective:   Jeremy Peterson participated from home, via video and consented to treatment. Therapist participated from office.He noted returning to the ER and noted feeling quite bad. He noted having a negative reaction to a sulfa medication which necessitated that visit. He noted vomiting and being dehydrated. He noted not being able to exercise due to this. He noted assessing  and needing progress on a consistent basis. We worked on exploring this during the session. We discussed the importance of setting reasonable expectations, ensuring that goals are reasonable and achievable, and ensuring flexibility in said expectations. Therapist encouraged Jeremy Peterson to create a temporary day plan with expectations for self and to assess them in the  context of the day, other responsibilities, and needing time to engage in self-care. We will on processing these during the following session. He noted a need to "not deal in extremes" in relation to tasks he engages in including exercising and healthful eating and noted often "going all out". He noted often overdoing activities such as video games. We discussed the importance of mindfulness and purposefulness. He is awaiting feedback from the psychiatrist to schedule a console. Jeremy Peterson was engaged and motivated during the session. He expressed commitment towards goals. Therapist validated Jeremy Peterson's feelings and experience and provided supportive therapy. A follow-up was scheduled for continued treatment which he benefits from.   Interventions: Psycho-education & CBT  Diagnosis:  Generalized anxiety disorder  Attention deficit hyperactivity disorder (ADHD), predominantly inattentive type  Psychiatric Treatment: Yes, via PCP.    Treatment Plan:  Client Abilities/Strengths Jeremy Peterson is forthcoming and motivated for change.   Support System: Family and friends.   Client Treatment Preferences Outpatient therapy.   Client Statement of Needs Jeremy Peterson would like to maintain mood balance, resolving interpersonal conflict, processing past events, managing self-talk, mindfulness, setting boundaries, self-care, managing interpersonal stressors, work on barriers towards employment, developing sustainable day-to-day routine, address marital stressors, improve communication, manage rumination, improve self-esteem. Additional goals include maintaining safety and completed safety plan.   Treatment Level Weekly  Symptoms  Depression:   loss of interest, feeling down, poor sleep, lethargy, poor appetite, feeling bad about self, difficulty concentrating, psychomotor retardation, previous SI (not current). (Status: maintained) Anxiety:   feeling anxious, difficulty managing  worry, worrying about different things,  trouble relaxing, restlessness, irritability, and feeling afraid something awful might happen.  (Status: maintained)  Goals:   Jeremy Peterson experiences symptoms of depression and anxiety.  Treatment plan signed and available on s-drive:  No, pending signature.    Target Date: 11/05/24 Frequency: Weekly  Progress: 0 Modality: individual    Therapist will provide referrals for additional resources as appropriate.  Therapist will provide psycho-education regarding Jeremy Peterson's diagnosis and corresponding treatment approaches and interventions. [name], Jeremy Peterson will support the patient's ability to achieve the goals identified. will employ CBT, BA, Problem-solving, Solution Focused, Mindfulness,  coping skills, & other evidenced-based practices will be used to promote progress towards healthy functioning to help manage decrease symptoms associated with his diagnosis.   Reduce overall level, frequency, and intensity of the feelings of depression, anxiety and panic evidenced by decreased overall symptoms from 6 to 7 days/week to 0 to 1 days/week per client report for at least 3 consecutive months. Verbally express understanding of the relationship between feelings of depression, anxiety and their impact on thinking patterns and behaviors. Verbalize an understanding of the role that distorted thinking plays in creating fears, excessive worry, and ruminations.    Jeremy Peterson participated in the creation of the treatment plan)   [name], Jeremy Peterson

## 2023-12-03 ENCOUNTER — Encounter: Payer: Self-pay | Admitting: Family Medicine

## 2023-12-03 ENCOUNTER — Ambulatory Visit (INDEPENDENT_AMBULATORY_CARE_PROVIDER_SITE_OTHER): Admitting: Psychology

## 2023-12-03 ENCOUNTER — Telehealth (INDEPENDENT_AMBULATORY_CARE_PROVIDER_SITE_OTHER): Payer: Medicaid Other | Admitting: Family Medicine

## 2023-12-03 DIAGNOSIS — F411 Generalized anxiety disorder: Secondary | ICD-10-CM

## 2023-12-03 DIAGNOSIS — F900 Attention-deficit hyperactivity disorder, predominantly inattentive type: Secondary | ICD-10-CM

## 2023-12-03 MED ORDER — CITALOPRAM HYDROBROMIDE 20 MG PO TABS: 20.0000 mg | ORAL_TABLET | Freq: Every day | ORAL | 2 refills | Status: DC

## 2023-12-03 NOTE — Progress Notes (Signed)
 CC: F/u  Subjective Jeremy Peterson presents for f/u anxiety. We are interacting via web portal for an electronic face-to-face visit. I verified patient's ID using 2 identifiers. Patient agreed to proceed with visit via this method. Patient is at home, I am at office. Patient and I are present for visit.   Pt is currently being treated with Celexa 20 mg/d  Reports 60-70% improvement since treatment. No thoughts of harming self or others. No self-medication with alcohol, prescription drugs or illicit drugs. Pt is following with a counselor/psychologist.  Past Medical History:  Diagnosis Date   Anxiety    Concussion    multiple from college wrestling, no residual   DEPRESSION    History of kidney stones    Migraines    Nephrolithiasis    PAC (premature atrial contraction)    no cardiology no treatment needed last pac few yrs ago   Allergies as of 12/03/2023       Reactions   Bactrim [sulfamethoxazole-trimethoprim] Nausea And Vomiting        Medication List        Accurate as of December 03, 2023  2:22 PM. If you have any questions, ask your nurse or doctor.          citalopram 20 MG tablet Commonly known as: CELEXA Take 1 tablet (20 mg total) by mouth daily.   clindamycin 1 % gel Commonly known as: Clindagel Apply topically 2 (two) times daily.   doxycycline 100 MG capsule Commonly known as: VIBRAMYCIN Take 1 capsule (100 mg total) by mouth 2 (two) times daily.   gabapentin 300 MG capsule Commonly known as: NEURONTIN Take 2 capsules (600 mg total) by mouth 3 (three) times daily as needed (Anxiety).   ondansetron 4 MG disintegrating tablet Commonly known as: ZOFRAN-ODT Take 1 tablet (4 mg total) by mouth every 8 (eight) hours as needed for nausea or vomiting.   rosuvastatin 20 MG tablet Commonly known as: Crestor Take 1 tablet (20 mg total) by mouth daily.   tamsulosin 0.4 MG Caps capsule Commonly known as: FLOMAX Take 1 capsule (0.4 mg total) by mouth  daily.        Exam No conversational dyspnea Age appropriate judgment and insight Nml affect and mood  Assessment and Plan  GAD (generalized anxiety disorder) - Plan: citalopram (CELEXA) 20 MG tablet  Chronic, stable. Cont Celexa 20 mg/d. Cont w counseling.  F/u in 6 mo. The patient voiced understanding and agreement to the plan.  Jilda Roche [place], DO 12/03/23 2:22 PM

## 2023-12-03 NOTE — Progress Notes (Signed)
 Jeremy Peterson Behavioral Health Counselor/Therapist Progress Note  Patient ID: Jeremy Peterson Peterson, MRN: [id number]   Date: 12/03/23  Time Spent: 10:04  am - 10:52 am :48 Minutes  Treatment Type: Individual Therapy.  Reported Symptoms: Anxiety and depression.   Mental Status Exam: Appearance:  Casual     Behavior: Appropriate  Motor: Normal  Speech/Language:  Clear and Coherent  Affect: Appropriate and Congruent  Mood: dysthymic  Thought process: normal  Thought content:   WNL  Sensory/Perceptual disturbances:   WNL  Orientation: oriented to person, place, time/date, and situation  Attention: Good  Concentration: Good  Memory: WNL  Fund of knowledge:  Good  Insight:   Good  Judgment:  Good  Impulse Control: Good    Risk Assessment: Danger to Self:  No Self-injurious Behavior: No Danger to Others: No Duty to Warn:no Physical Aggression / Violence:No  Access to Firearms a concern: No  Gang Involvement:No   Jeremy Peterson case of a mental health emergency:  94 - confidential suicide hotline. Visiting [facility]):        [address], [place] [address]       [phone] 3.   911  4.   Visiting Nearest ED.    Subjective:   Jeremy Peterson Peterson participated from home, via video and consented to treatment. Therapist participated from office. Jeremy Peterson Peterson noted it being "an okay week". We worked on highlighting the positives during the past week. He noted having an interview today. He noted looking forward to the interview. He noted that his mind "has been all over the place" and he noted difficulty focusing. He noted feeling "so far behind" which makes it "hard to narrow the list and not feel far behind". He noted a need to "do better". He noted a need to "put more realistic timelines for myself". He noted "If I don't feel like I am performing, I struggle". He noted that he "doesn't get behind well". Therapist encouraged Jeremy Peterson Peterson to identify how he has operated Jeremy Peterson  the past. We discussed the importance of identifying the perceived pros and cons. He noted being "black or white" and being "very hard on myself and I just feel like I deserve ".  He noted that his wife often has trouble waking Jeremy Peterson the morning and can get "foul" and noted his children witnessing this. He noted that his wife often will bring up past arguments or frustrations during the current disagreement. He noted "that she can be real aggressive wit stuff like that (not physically aggressive". He noted that his wife will generalize and he noted often feeling defensive, Jeremy Peterson response. Therapist reviewed the fair fighting rules, worked on delineating the responsibility each person has Jeremy Peterson arguments and disagreements, and discussed the importance of adopting a viable approach to resolve conflict. Therapist modeled communication, conflict resolution, assertiveness, and distress tolerance Jeremy Peterson relation to these interpersonal interactions. Therapist discussed the importance of modeling adaptive, positive, and health conflict resolution for children while minimizing their exposure to maladaptive interactions. Therapist discussed the importance of boundaries for self and not reciprocating negatively Jeremy Peterson a relationship. Jeremy Peterson Peterson was engaged and motivated during the session. He expressed commitment towards our goals. Therapist praised Jeremy Peterson for his effort and energy during the session and provided supportive therapy.   Interventions: Psycho-education & CBT  Diagnosis:  No diagnosis found.  Psychiatric Treatment: Yes, via PCP.    Treatment Plan:  Client Abilities/Strengths Jeremy Peterson Peterson forthcoming and motivated for change.   Support System: Family and  friends.   Client Treatment Preferences Outpatient therapy.   Client Statement of Needs Jeremy Peterson Peterson would like to maintain mood balance, resolving interpersonal conflict, processing past events, managing self-talk, mindfulness, setting boundaries, self-care, managing  interpersonal stressors, work on barriers towards employment, developing sustainable day-to-day routine, address marital stressors, improve communication, manage rumination, improve self-esteem. Additional goals include maintaining safety and completed safety plan.   Treatment Level Weekly  Symptoms  Depression:   loss of interest, feeling down, poor sleep, lethargy, poor appetite, feeling bad about self, difficulty concentrating, psychomotor retardation, previous SI (not current). (Status: maintained) Anxiety:   feeling anxious, difficulty managing worry, worrying about different things, trouble relaxing, restlessness, irritability, and feeling afraid something awful might happen.  (Status: maintained)  Goals:   Jeremy Peterson experiences symptoms of depression and anxiety.  Treatment plan signed and available on s-drive:  No, pending signature.    Target Date: 11/05/24 Frequency: Weekly  Progress: 0 Modality: individual    Therapist will provide referrals for additional resources as appropriate.  Therapist will provide psycho-education regarding Jeremy Peterson Peterson's diagnosis and corresponding treatment approaches and interventions. Jeremy Peterson Ovens, LCSW will support the patient's ability to achieve the goals identified. will employ CBT, BA, Problem-solving, Solution Focused, Mindfulness,  coping skills, & other evidenced-based practices will be used to promote progress towards healthy functioning to help manage decrease symptoms associated with his diagnosis.   Reduce overall level, frequency, and intensity of the feelings of depression, anxiety and panic evidenced by decreased overall symptoms from 6 to 7 days/week to 0 to 1 days/week per client report for at least 3 consecutive months. Verbally express understanding of the relationship between feelings of depression, anxiety and their impact on thinking patterns and behaviors. Verbalize an understanding of the role that distorted thinking plays Jeremy Peterson creating  fears, excessive worry, and ruminations.    Jeremy Peterson Peterson participated Jeremy Peterson the creation of the treatment plan)   Jeremy Peterson Ovens, LCSW

## 2023-12-10 ENCOUNTER — Ambulatory Visit: Admitting: Psychology

## 2023-12-17 ENCOUNTER — Other Ambulatory Visit

## 2023-12-17 ENCOUNTER — Other Ambulatory Visit: Payer: Self-pay | Admitting: Family Medicine

## 2023-12-17 ENCOUNTER — Encounter: Admitting: Psychology

## 2023-12-17 DIAGNOSIS — F411 Generalized anxiety disorder: Secondary | ICD-10-CM

## 2023-12-17 NOTE — Progress Notes (Signed)
 This encounter was created in error - please disregard.

## 2023-12-17 NOTE — Progress Notes (Deleted)
° ° ° ° ° ° ° ° ° ° ° ° ° ° °  Khayden Herzberg, LCSW °

## 2024-01-23 ENCOUNTER — Other Ambulatory Visit: Payer: Self-pay | Admitting: Family Medicine

## 2024-02-04 ENCOUNTER — Ambulatory Visit (HOSPITAL_COMMUNITY)
Admission: EM | Admit: 2024-02-04 | Discharge: 2024-02-04 | Disposition: A | Discharge status: Home / Self Care | Attending: Psychiatry | Admitting: Psychiatry

## 2024-02-04 DIAGNOSIS — F339 Major depressive disorder, recurrent, unspecified: Secondary | ICD-10-CM | POA: Insufficient documentation

## 2024-02-04 DIAGNOSIS — F419 Anxiety disorder, unspecified: Secondary | ICD-10-CM | POA: Insufficient documentation

## 2024-02-04 DIAGNOSIS — Z630 Problems in relationship with spouse or partner: Secondary | ICD-10-CM | POA: Diagnosis not present

## 2024-02-04 DIAGNOSIS — R45851 Suicidal ideations: Secondary | ICD-10-CM | POA: Insufficient documentation

## 2024-02-04 DIAGNOSIS — R4589 Other symptoms and signs involving emotional state: Secondary | ICD-10-CM | POA: Diagnosis not present

## 2024-02-04 DIAGNOSIS — F411 Generalized anxiety disorder: Secondary | ICD-10-CM

## 2024-02-04 LAB — POCT URINE DRUG SCREEN - MANUAL ENTRY (I-SCREEN)
POC Amphetamine UR: NOT DETECTED
POC Buprenorphine (BUP): NOT DETECTED
POC Cocaine UR: NOT DETECTED
POC Marijuana UR: NOT DETECTED
POC Methadone UR: NOT DETECTED
POC Methamphetamine UR: NOT DETECTED
POC Morphine: NOT DETECTED
POC Oxazepam (BZO): NOT DETECTED
POC Oxycodone UR: NOT DETECTED
POC Secobarbital (BAR): NOT DETECTED

## 2024-02-04 LAB — CBC WITH DIFFERENTIAL/PLATELET
Abs Immature Granulocytes: 0.02 10*3/uL (ref 0.00–0.07)
Basophils Absolute: 0 10*3/uL (ref 0.0–0.1)
Basophils Relative: 0 %
Eosinophils Absolute: 0.1 10*3/uL (ref 0.0–0.5)
Eosinophils Relative: 1 %
HCT: 46.7 % (ref 39.0–52.0)
Hemoglobin: 16.1 g/dL (ref 13.0–17.0)
Immature Granulocytes: 0 %
Lymphocytes Relative: 20 %
Lymphs Abs: 2.2 10*3/uL (ref 0.7–4.0)
MCH: 29.7 pg (ref 26.0–34.0)
MCHC: 34.5 g/dL (ref 30.0–36.0)
MCV: 86 fL (ref 80.0–100.0)
Monocytes Absolute: 0.7 10*3/uL (ref 0.1–1.0)
Monocytes Relative: 6 %
Neutro Abs: 8 10*3/uL — ABNORMAL HIGH (ref 1.7–7.7)
Neutrophils Relative %: 73 %
Platelets: 290 10*3/uL (ref 150–400)
RBC: 5.43 MIL/uL (ref 4.22–5.81)
RDW: 12.1 % (ref 11.5–15.5)
WBC: 11.1 10*3/uL — ABNORMAL HIGH (ref 4.0–10.5)
nRBC: 0 % (ref 0.0–0.2)

## 2024-02-04 LAB — COMPREHENSIVE METABOLIC PANEL WITH GFR
ALT: 26 U/L (ref 0–44)
AST: 22 U/L (ref 15–41)
Albumin: 4.8 g/dL (ref 3.5–5.0)
Alkaline Phosphatase: 47 U/L (ref 38–126)
Anion gap: 13 (ref 5–15)
BUN: 8 mg/dL (ref 6–20)
CO2: 24 mmol/L (ref 22–32)
Calcium: 9.7 mg/dL (ref 8.9–10.3)
Chloride: 99 mmol/L (ref 98–111)
Creatinine, Ser: 0.91 mg/dL (ref 0.61–1.24)
GFR, Estimated: >60 mL/min (ref >60)
Glucose, Bld: 82 mg/dL (ref 70–99)
Potassium: 3.5 mmol/L (ref 3.5–5.1)
Sodium: 136 mmol/L (ref 135–145)
Total Bilirubin: 1.1 mg/dL (ref 0.0–1.2)
Total Protein: 7.9 g/dL (ref 6.5–8.1)

## 2024-02-04 LAB — ETHANOL: Alcohol, Ethyl (B): <15 mg/dL (ref <15)

## 2024-02-04 LAB — TSH: TSH: 1.291 u[IU]/mL (ref 0.350–4.500)

## 2024-02-04 MED ORDER — ALUM & MAG HYDROXIDE-SIMETH 200-200-20 MG/5ML PO SUSP: 30.0000 mL | ORAL | Status: DC | PRN

## 2024-02-04 MED ORDER — OLANZAPINE 5 MG PO TBDP
5.0000 mg | ORAL_TABLET | Freq: Three times a day (TID) | ORAL | Status: DC | PRN
  Administered 2024-02-04: 5 mg via ORAL
  Filled 2024-02-04: qty 1

## 2024-02-04 MED ORDER — HYDROXYZINE HCL 10 MG PO TABS: 25.0000 mg | ORAL_TABLET | Freq: Three times a day (TID) | ORAL | 0 refills | Status: DC | PRN

## 2024-02-04 MED ORDER — CITALOPRAM HYDROBROMIDE 20 MG PO TABS
20.0000 mg | ORAL_TABLET | Freq: Every day | ORAL | Status: DC
  Administered 2024-02-04: 20 mg via ORAL
  Filled 2024-02-04: qty 1

## 2024-02-04 MED ORDER — CLONIDINE HCL 0.1 MG PO TABS
0.1000 mg | ORAL_TABLET | Freq: Once | ORAL | Status: AC
  Administered 2024-02-04: 0.1 mg via ORAL
  Filled 2024-02-04: qty 1

## 2024-02-04 MED ORDER — ACETAMINOPHEN 325 MG PO TABS: 650.0000 mg | ORAL_TABLET | Freq: Four times a day (QID) | ORAL | Status: DC | PRN

## 2024-02-04 MED ORDER — CITALOPRAM HYDROBROMIDE 20 MG PO TABS: 30.0000 mg | ORAL_TABLET | Freq: Every day | ORAL | 0 refills | Status: DC

## 2024-02-04 MED ORDER — MAGNESIUM HYDROXIDE 400 MG/5ML PO SUSP: 30.0000 mL | Freq: Every day | ORAL | Status: DC | PRN

## 2024-02-04 MED ORDER — OLANZAPINE 10 MG IM SOLR: 5.0000 mg | Freq: Three times a day (TID) | INTRAMUSCULAR | Status: DC | PRN

## 2024-02-04 MED ORDER — OLANZAPINE 10 MG IM SOLR: 10.0000 mg | Freq: Three times a day (TID) | INTRAMUSCULAR | Status: DC | PRN

## 2024-02-04 NOTE — ED Provider Notes (Addendum)
 [facility]BC/OBS ASAP Discharge Summary  Date and Time: 02/04/2024 11:16 AM  Name: Jeremy Peterson  MRN:  [id number]   Discharge Diagnoses:  Final diagnoses:  Recurrent major depressive disorder, remission status unspecified (HCC)  Suicidal thoughts  Marital problem  Difficulty coping  Anxious appearance    Subjective: Jeremy Peterson, 38 y/o male with a history of MDD, and anxiety, presented to [facility] voluntarily due to depression and really bad thoughts.   Today, patient is denying SI and states that he needed some time to get away from his stressors at home. He reports that his wife is going through the recent loss of her brother and he has been having a difficult time coping with the stress. He states his motivating factors for living are his three children. He reports taking Celexa  that is being managed by his PCP Dr. Gwenette Lennox. He reports worsening anxiety and he has been taking "hydroxyzine  75" 4x daily and he obtains this from a vape store. I counseled patient to stop using that medication as this is unregulated with a high likelyhood to be mixed with other substances. He agrees to instead take hydroxyzine  that I will prescribe. He also states that he will follow up with his PCP in 2 weeks and agrees to increase his Celexa  to 30 mg for his worsening depression and anxiety. I discussed that if patient notices intolerance to the medication that he can decrease back to 20 mg. He contracts for safety and we discussed a safety plan including warning signs, coping strategies, and possible people to contact and resources to utilize if suicidal thoughts worsen. He agrees to go to the [facility] clinic during walk in hours to get established with outpatient. He also denies HI and AVH.   I spoke with patient's wife Dajohn Ellender and she states that yesterday, the patient made statements like "I can't do this anymore. I am a loser". She is wanting to be separate from the patient for some time due to the recent stressor  of her brother's passing. She confirms that there are no firearms or illicit substances in the home. She agrees that if patient's symptoms worsen that he can return to [facility] for reassessment.  Stay Summary: The patient was evaluated each day by a clinical provider to ascertain response to treatment. Improvement was noted by the patient's report of decreasing symptoms, improved sleep and appetite, affect, medication tolerance, behavior, and participation in unit programming.  Patient was asked each day to complete a self inventory noting mood, mental status, pain, new symptoms, anxiety and concerns.  The patient's medications were managed with the following directions: -Increased Celexa  to 30 mg with plan to follow up with his PCP who manages his medication -Start Atarax  25 mg TID PRN for anxiety  Patient responded well to medication and being in a therapeutic and supportive environment. Positive and appropriate behavior was noted and the patient was motivated for recovery. The patient worked closely with the treatment team and case manager to develop a discharge plan with appropriate goals. Coping skills, problem solving as well as relaxation therapies were also part of the unit programming.  Total Time spent with patient: 30 minutes  Past Psychiatric History: GAD, ADHD; taking Celexa , denies previous inpatient psychiatric hospitalizations; reports 2 prior suicide attempts both in his 20's via overdosing; denies SIB Past Medical History: none reported Social History: living with wife and children, job is contract work, support system is his mother and wife Tobacco Cessation:  N/A, patient does not currently  use tobacco products  Current Medications:  Current Facility-Administered Medications  Medication Dose Route Frequency Provider Last Rate Last Admin   acetaminophen  (TYLENOL ) tablet 650 mg  650 mg Oral Q6H PRN Dorthea Gauze, NP       alum & mag hydroxide-simeth (MAALOX/MYLANTA) 200-200-20  MG/5ML suspension 30 mL  30 mL Oral Q4H PRN Dorthea Gauze, NP       citalopram  (CELEXA ) tablet 20 mg  20 mg Oral Daily Xochitl Egle B, MD   20 mg at 02/04/24 1011   magnesium hydroxide (MILK OF MAGNESIA) suspension 30 mL  30 mL Oral Daily PRN Dorthea Gauze, NP       OLANZapine (ZYPREXA) injection 10 mg  10 mg Intramuscular TID PRN Dorthea Gauze, NP       OLANZapine (ZYPREXA) injection 5 mg  5 mg Intramuscular TID PRN Dorthea Gauze, NP       OLANZapine zydis (ZYPREXA) disintegrating tablet 5 mg  5 mg Oral TID PRN Dorthea Gauze, NP   5 mg at 02/04/24 1308   Current Outpatient Medications  Medication Sig Dispense Refill   hydrOXYzine  (ATARAX ) 10 MG tablet Take 2.5 tablets (25 mg total) by mouth 3 (three) times daily as needed. 30 tablet 0   rosuvastatin  (CRESTOR ) 20 MG tablet Take 1 tablet (20 mg total) by mouth daily. 90 tablet 3   citalopram  (CELEXA ) 20 MG tablet Take 1.5 tablets (30 mg total) by mouth daily. 30 tablet 0    PTA Medications:  Facility Ordered Medications  Medication   acetaminophen  (TYLENOL ) tablet 650 mg   alum & mag hydroxide-simeth (MAALOX/MYLANTA) 200-200-20 MG/5ML suspension 30 mL   magnesium hydroxide (MILK OF MAGNESIA) suspension 30 mL   OLANZapine zydis (ZYPREXA) disintegrating tablet 5 mg   OLANZapine (ZYPREXA) injection 5 mg   OLANZapine (ZYPREXA) injection 10 mg   [COMPLETED] cloNIDine (CATAPRES) tablet 0.1 mg   citalopram  (CELEXA ) tablet 20 mg   PTA Medications  Medication Sig   rosuvastatin  (CRESTOR ) 20 MG tablet Take 1 tablet (20 mg total) by mouth daily.   citalopram  (CELEXA ) 20 MG tablet Take 1.5 tablets (30 mg total) by mouth daily.       10/22/2023    2:20 PM 10/19/2022    2:49 PM 10/19/2022    2:46 PM  Depression screen PHQ 2/9  Decreased Interest 1 0 0  Down, Depressed, Hopeless 1 0 0  PHQ - 2 Score 2 0 0  Altered sleeping 0 2 0  Tired, decreased energy 0 0 0  Change in appetite  0 0  Feeling bad or failure about yourself  0 0 0  Trouble  concentrating 0 0   Moving slowly or fidgety/restless 0 0   Suicidal thoughts 0 0   PHQ-9 Score 2 2 0  Difficult doing work/chores Not difficult at all Not difficult at all     [facility] from 02/04/2024 in [facility] ED from 11/22/2023 in [facility] Emergency Department at [facility] ED from 11/20/2023 in [facility] Emergency Department at [facility]  C-SSRS RISK CATEGORY Moderate Risk No Risk No Risk       Musculoskeletal  Strength & Muscle Tone: within normal limits Gait & Station: normal Patient leans: N/A  Psychiatric Specialty Exam  Presentation  General Appearance:  Casual  Eye Contact: Good  Speech: Clear and Coherent  Speech Volume: Decreased  Handedness: Right   Mood and Affect  Mood: Anxious; Depressed  Affect: Congruent   Thought Process  Thought Processes: Coherent  Descriptions of Associations:Intact  Orientation:Full (Time, Place and Person)  Thought Content:Logical  Diagnosis of Schizophrenia or Schizoaffective disorder in past: No    Hallucinations:Hallucinations: None  Ideas of Reference:None  Suicidal Thoughts:Suicidal Thoughts: No  Homicidal Thoughts:Homicidal Thoughts: No   Sensorium  Memory: Immediate Fair  Judgment: Fair  Insight: Fair   Art therapist  Concentration: Fair  Attention Span: Good  Recall: Good  Fund of Knowledge: Good  Language: Good   Psychomotor Activity  Psychomotor Activity: Psychomotor Activity: Normal   Assets  Assets: Desire for Improvement; Resilience; Social Support   Sleep  Sleep: Sleep: Fair Number of Hours of Sleep: 6   Nutritional Assessment (For OBS and FBC admissions only) Has the patient had a weight loss or gain of 10 pounds or more in the last 3 months?: No Has the patient had a decrease in food intake/or appetite?: No Does the patient have dental problems?: No Does the patient have eating  habits or behaviors that may be indicators of an eating disorder including binging or inducing vomiting?: No Has the patient recently lost weight without trying?: 0 Has the patient been eating poorly because of a decreased appetite?: 0 Malnutrition Screening Tool Score: 0    Physical Exam  Physical Exam Vitals reviewed.  Constitutional:      Appearance: Normal appearance.  HENT:     Head: Normocephalic and atraumatic.  Cardiovascular:     Rate and Rhythm: Normal rate.  Pulmonary:     Effort: Pulmonary effort is normal.  Neurological:     General: No focal deficit present.     Mental Status: He is alert and oriented to person, place, and time.    Review of Systems  Constitutional:  Negative for chills and fever.  Respiratory:  Negative for shortness of breath.   Cardiovascular:  Negative for chest pain and palpitations.  Gastrointestinal:  Negative for nausea and vomiting.  Neurological:  Negative for headaches.   Blood pressure 126/82, pulse 64, temperature 98.4 F (36.9 C), temperature source Oral, resp. rate 16, SpO2 100%. There is no height or weight on file to calculate BMI.  Demographic Factors:  Male and Caucasian  Loss Factors: Loss of significant relationship  Historical Factors: Prior suicide attempts and Impulsivity  Risk Reduction Factors:   Responsible for children under 44 years of age, Sense of responsibility to family, Employed, Living with another person, especially a relative, and Positive social support  Continued Clinical Symptoms:  Depression:   Anhedonia Impulsivity  Cognitive Features That Contribute To Risk:  Closed-mindedness    Suicide Risk:  Mild:  Suicidal ideation of limited frequency, intensity, duration, and specificity.  There are no identifiable plans, no associated intent, mild dysphoria and related symptoms, good self-control (both objective and subjective assessment), few other risk factors, and identifiable protective factors,  including available and accessible social support.  Plan Of Care/Follow-up recommendations:  Activity as tolerated Regular diet Continue prescription medications See PCP for medical conditions   Disposition: Home with OP resources  Saratha Cunas, MD PGY-2 Psychiatry

## 2024-02-04 NOTE — ED Notes (Signed)
 Patient discharged in stable condition with all belongings. Patient denies SI, HI, AVH.

## 2024-02-04 NOTE — Discharge Instructions (Addendum)
 For [facility] residents who are uninsured or have Medicare/Medicaid:  Please come to [facility] (this facility) during walk in hours for appointment with psychiatrist for further medication management and for therapists for therapy.    Walk in hours are 8-11 AM Monday through Thursday for medication management.Child and adolescent psychiatrists are only available on Wednesdays and Thursdays during walk in hours.  Therapy walk in hours are Monday-Wednesday 8 AM-1PM.   It is first come, first -serve; it is best to arrive by 7:00 AM.   On Friday from 1 pm to 4 pm for therapy intake only. Please arrive by 12:00 pm as it is  first come, first -serve.    When you arrive please go upstairs for your appointment. If you are unsure of where to go, inform the front desk that you are here for a walk in appointment and they will assist you with directions upstairs.  Address:  [address] in [place], [address] Ph: [phone]    Follow-up recommendations:  Activity:  Normal, as tolerated Diet:  Per PCP recommendation  Patient is instructed prior to discharge to: Take all medications as prescribed by his mental healthcare provider. Report any adverse effects and/or reactions from the medicines to his outpatient provider promptly. Patient has been instructed & cautioned: To not engage in alcohol and or illegal drug use while on prescription medicines.  In the event of worsening symptoms, patient is instructed to call the crisis hotline at 988, 911 and or go to the nearest ED for appropriate evaluation and treatment of symptoms. To follow-up with his primary care provider for your other medical issues, concerns and or health care needs.

## 2024-02-04 NOTE — BH Assessment (Addendum)
 Comprehensive Clinical Assessment (CCA) Note  02/04/2024 Jeremy Peterson [id number]  Disposition: Dorthea Gauze, NP, recommends inpatient psychiatric treatment.  Chief Complaint:  Chief Complaint  Patient presents with   Depression   suicidal ideation   Visit Diagnosis:  Major Depressive Disorder   The patient demonstrates the following risk factors for suicide: Chronic risk factors for suicide include: psychiatric disorder of anxiety and depression, previous suicide attempts 20's attempted overdose on pills, and previous self-harm today patient punched a wall. Acute risk factors for suicide include: family or marital conflict, social withdrawal/isolation, and loss (financial, interpersonal, professional). Protective factors for this patient include: positive therapeutic relationship, responsibility to others (children, family), coping skills, and hope for the future. Considering these factors, the overall suicide risk at this point appears to be high. Patient is not appropriate for outpatient follow up.   Jeremy Peterson is a 38 year old male presenting as a voluntary walk-in to [facility] due to SI with plan to "run into something". Patient has history of depression and anxiety. Patient denied HI, psychosis and alcohol/drug usage.   Patient reports triggers/stressors "everything I guess, I don't know hot handle it, I am good for a while, then its a roller coaster, having panic attacks, I can be good, then next my body don't feel like doing this". Patient also reports marital discord and financial problems. Patient has scrapes on his hand due to getting upset earlier and punching a wall. I will patient reports worsening depressive symptoms. Patient reports ongoing depression for a long time. Patient reports poor sleep and appetite.   Patient reports suicide attempt of overdose in his 90's. Patient denied prior self-harming behaviors.   Patient was being seen at [facility] for therapy and medication  management, however he stopped going to therapy 3 weeks ago when he secured new employment. Patient reports feeling that his medications works sometimes.  Patient denies a prior psychiatric inpatient treatment.  Patient lives with wife and 3 children (4, 6, 7).  Patient has been married for 7-1/2 years.  Patient reports marital discord.  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  Patient reports no access to guns.  Patient was tearful, calm and cooperative during assessment. Patient unable to to contract for safety.     CCA Screening, Triage and Referral (STR)  Patient Reported Information How did you hear about us ? Self  What Is the Reason for Your Visit/Call Today? Pt presents to [facility] as a voluntary walk-in, unaccompanied with complaint of worsening depression and SI, with a plan to "drive his car into something". Pt is very flat, depressed, tearful at times. Pt reports marital issues and feeling as if he is on an emotional roller coaster. Pt reports hx of anxiety and is prescribed Celexa . Pt is compliant with medication regimen. Pt also reports past attempts to overdose on medication in his 10s. Pt denies impatient treatment at that time. Pt observed with minor scrapes/scratches on his hand due to punching a wall earlier. Pt reports that he was receiving outpatient therapy up until about 3 weeks ago, but was dropped due to missed appointments. Pt currently denies HI,AVH and alcohol use.  How Long Has This Been Causing You Problems? 1-6 months  What Do You Feel Would Help You the Most Today? Treatment for Depression or other mood problem; Medication(s)   Have You Recently Had Any Thoughts About Hurting Yourself? Yes  Are You Planning to Commit Suicide/Harm Yourself At This time? No   Flowsheet Row ED from 02/04/2024 in  [facility] ED from 11/22/2023 in [facility] Emergency Department at [facility] ED from 11/20/2023 in [facility] Emergency Department at  [facility]  C-SSRS RISK CATEGORY Moderate Risk No Risk No Risk       Have you Recently Had Thoughts About Hurting Someone Marigene Shoulder? No  Are You Planning to Harm Someone at This Time? No  Explanation: n/a   Have You Used Any Alcohol or Drugs in the Past 24 Hours? Yes  How Long Ago Did You Use Drugs or Alcohol? N/a What Did You Use and How Much? THC   Do You Currently Have a Therapist/Psychiatrist? No  Name of Therapist/Psychiatrist:  n/a  Have You Been Recently Discharged From Any Office Practice or Programs? No  Explanation of Discharge From Practice/Program: n/a    CCA Screening Triage Referral Assessment Type of Contact: Face-to-Face  Telemedicine Service Delivery:  n/a Is this Initial or Reassessment?  N/a Date Telepsych consult ordered in [facility]:   N/a Time Telepsych consult ordered in [facility]:   N/a Location of Assessment: [facility] Assessment Services  Provider Location: [facility] Assessment Services   Collateral Involvement: none   Does Patient Have a Automotive engineer Guardian? No  Legal Guardian Contact Information: n/a  Copy of Legal Guardianship Form: -- (n/a)  Legal Guardian Notified of Arrival: -- (n/a)  Legal Guardian Notified of Pending Discharge: -- (n/a)  If Minor and Not Living with Parent(s), Who has Custody? n/a  Is CPS involved or ever been involved? Never  Is APS involved or ever been involved? Never   Patient Determined To Be At Risk for Harm To Self or Others Based on Review of Patient Reported Information or Presenting Complaint? Yes, for Self-Harm  Method: Plan with intent and identified person  Availability of Means: In hand or used  Intent: Clearly intends on inflicting harm that could cause death  Notification Required: No need or identified person  Additional Information for Danger to Others Potential: -- (n/a)  Additional Comments for Danger to Others Potential: n/a  Are There Guns or Other Weapons in Your Home?  No  Types of Guns/Weapons: n/a  Are These Weapons Safely Secured?                            -- (n/a)  Who Could Verify You Are Able To Have These Secured: n/a  Do You Have any Outstanding Charges, Pending Court Dates, Parole/Probation? none reported  Contacted To Inform of Risk of Harm To Self or Others: Family/Significant Other:    Does Patient Present under Involuntary Commitment? No    [place] of Residence: [place]   Patient Currently Receiving the Following Services: Medication Management   Determination of Need: Urgent (48 hours)   Options For Referral: [facility] Urgent Care; Outpatient Therapy; Medication Management; Inpatient Hospitalization     CCA Biopsychosocial Patient Reported Schizophrenia/Schizoaffective Diagnosis in Past: No   Strengths: self-awareness   Mental Health Symptoms Depression:  Increase/decrease in appetite; Hopelessness; Fatigue; Difficulty Concentrating; Change in energy/activity; Sleep (too much or little); Tearfulness   Duration of Depressive symptoms: Duration of Depressive Symptoms: Greater than two weeks   Mania:  None   Anxiety:   Worrying; Tension; Sleep; Restlessness; Fatigue; Difficulty concentrating   Psychosis:  None   Duration of Psychotic symptoms:    Trauma:  None   Obsessions:  None   Compulsions:  None   Inattention:  None   Hyperactivity/Impulsivity:  None   Oppositional/Defiant Behaviors:  None   Emotional Irregularity:  None   Other Mood/Personality Symptoms:  n/a    Mental Status Exam Appearance and self-care  Stature:  Average   Weight:  Average weight   Clothing:  Age-appropriate   Grooming:  Normal   Cosmetic use:  None   Posture/gait:  Normal   Motor activity:  Not Remarkable   Sensorium  Attention:  Normal   Concentration:  Normal   Orientation:  X5   Recall/memory:  Normal   Affect and Mood  Affect:  Anxious; Depressed   Mood:  Depressed; Anxious   Relating  Eye contact:   Normal   Facial expression:  Depressed; Sad; Anxious   Attitude toward examiner:  Cooperative   Thought and Language  Speech flow: Normal   Thought content:  Appropriate to Mood and Circumstances   Preoccupation:  None   Hallucinations:  None   Organization:  Coherent   [employer] of Knowledge:  Average   Intelligence:  Average   Abstraction:  Normal   Judgement:  Impaired   Reality Testing:  Adequate   Insight:  Lacking   Decision Making:  Impulsive   Social Functioning  Social Maturity:  Impulsive   Social Judgement:  Normal   Stress  Stressors:  Transitions; Work; Relationship; Family conflict   Coping Ability:  Overwhelmed; Exhausted   Skill Deficits:  Communication; Decision making; Self-control; Responsibility; Activities of daily living; Self-care   Supports:  Family; Support needed     Religion: Religion/Spirituality Are You A Religious Person?: Yes How Might This Affect Treatment?: none  Leisure/Recreation: Leisure / Recreation Do You Have Hobbies?: No  Exercise/Diet: Exercise/Diet Do You Exercise?: No Have You Gained or Lost A Significant Amount of Weight in the Past Six Months?: No Do You Follow a Special Diet?: No Do You Have Any Trouble Sleeping?: Yes Explanation of Sleeping Difficulties: up and down   CCA Employment/Education Employment/Work Situation: Employment / Work Situation Employment Situation: Employed Work Stressors: increased Patient's Job has Been Impacted by Current Illness: No Has Patient ever Been in Equities trader?: No  Education: Education Is Patient Currently Attending School?: No Last Grade Completed: 14 Did You Product manager?: Yes What Type of College Degree Do you Have?: "some college" Did You Have An Individualized Education Program (IIEP): No Did You Have Any Difficulty At School?: No Patient's Education Has Been Impacted by Current Illness: No   CCA Family/Childhood History Family  and Relationship History: Family history Marital status: Married Number of Years Married: 7 What types of issues is patient dealing with in the relationship?: uta Additional relationship information: n/a Does patient have children?: Yes How many children?: 3 How is patient's relationship with their children?: good  Childhood History:  Childhood History By whom was/is the patient raised?: Mother Did patient suffer any verbal/emotional/physical/sexual abuse as a child?: No Did patient suffer from severe childhood neglect?: No Has patient ever been sexually abused/assaulted/raped as an adolescent or adult?: No Was the patient ever a victim of a crime or a disaster?: No Witnessed domestic violence?: No Has patient been affected by domestic violence as an adult?: No       CCA Substance Use Alcohol/Drug Use: Alcohol / Drug Use Pain Medications: see MAR Prescriptions: see MAR Over the Counter: see MAR History of alcohol / drug use?: No history of alcohol / drug abuse Longest period of sobriety (when/how long): n/a Negative Consequences of Use:  (n/a) Withdrawal Symptoms: None  ASAM's:  Six Dimensions of Multidimensional Assessment  Dimension 1:  Acute Intoxication and/or Withdrawal Potential:   Dimension 1:  Description of individual's past and current experiences of substance use and withdrawal: n/a  Dimension 2:  Biomedical Conditions and Complications:   Dimension 2:  Description of patient's biomedical conditions and  complications: n/a  Dimension 3:  Emotional, Behavioral, or Cognitive Conditions and Complications:  Dimension 3:  Description of emotional, behavioral, or cognitive conditions and complications: n/a  Dimension 4:  Readiness to Change:  Dimension 4:  Description of Readiness to Change criteria: n/a  Dimension 5:  Relapse, Continued use, or Continued Problem Potential:  Dimension 5:  Relapse, continued use, or continued problem  potential critiera description: n/a  Dimension 6:  Recovery/Living Environment:  Dimension 6:  Recovery/Iiving environment criteria description: n/a  ASAM Severity Score:    ASAM Recommended Level of Treatment: ASAM Recommended Level of Treatment:  (n/a)   Substance use Disorder (SUD) Substance Use Disorder (SUD)  Checklist Symptoms of Substance Use:  (n/a)  Recommendations for Services/Supports/Treatments: Recommendations for Services/Supports/Treatments Recommendations For Services/Supports/Treatments: Individual Therapy, Inpatient Hospitalization, Medication Management  Disposition Recommendation per psychiatric provider:  Recommends inpatient psychiatric treatment.    DSM5 Diagnoses: Patient Active Problem List   Diagnosis Date Noted   GAD (generalized anxiety disorder) 12/03/2023   Palpitations 05/06/2013   DEPRESSION 10/27/2010   FATIGUE 10/27/2010   DIABETES MELLITUS, BORDERLINE 10/27/2010     Referrals to Alternative Service(s): Referred to Alternative Service(s):   Place:   Date:   Time:    Referred to Alternative Service(s):   Place:   Date:   Time:    Referred to Alternative Service(s):   Place:   Date:   Time:    Referred to Alternative Service(s):   Place:   Date:   Time:     Adelfa Adolph, [facility]

## 2024-02-04 NOTE — ED Notes (Signed)
 Patient A&O x 4, calm, cooperative, and soft spoken. Patient denies SI, HI, AVH. Patient does not appear to be responding to internal stimuli. Patient appears to be deep in thought at times. Patient reported he takes Celexa  20 mg daily at 0700 and felt a little funny since he had not received it, provider notified and Celexa  ordered and administered.

## 2024-02-04 NOTE — Progress Notes (Signed)
   02/04/24 0131  [facility] Triage Screening (Walk-ins at [facility] only)  How Did You Hear About Us ? Self  What Is the Reason for Your Visit/Call Today? Pt presents to [facility] as a voluntary walk-in, unaccompanied with complaint of worsening depression and SI, with a plan to "drive his car into something". Pt is very flat, depressed, tearful at times. Pt reports marital issues and feeling as if he is on an emotional roller coaster. Pt reports hx of anxiety and is prescribed Celexa . Pt is compliant with medication regimen. Pt also reports past attempts to overdose on medication in his 42s. Pt denies impatient treatment at that time. Pt observed with minor scrapes/scratches on his hand due to punching a wall earlier. Pt reports that he was receiving outpatient therapy up until about 3 weeks ago, but was dropped due to missed appointments. Pt currently denies HI,AVH and alcohol use.  How Long Has This Been Causing You Problems? 1-6 months  Have You Recently Had Any Thoughts About Hurting Yourself? Yes  How long ago did you have thoughts about hurting yourself? currently  Are You Planning to Commit Suicide/Harm Yourself At This time? No  Have you Recently Had Thoughts About Hurting Someone Marigene Shoulder? No  Are You Planning To Harm Someone At This Time? No  Physical Abuse Denies  Verbal Abuse Denies  Sexual Abuse Denies  Exploitation of patient/patient's resources Denies  Self-Neglect Denies  Are you currently experiencing any auditory, visual or other hallucinations? No  Have You Used Any Alcohol or Drugs in the Past 24 Hours? Yes  What Did You Use and How Much? THC  Do you have any current medical co-morbidities that require immediate attention? No  Clinician description of patient physical appearance/behavior: very tearful, flat affect, depressed  What Do You Feel Would Help You the Most Today? Treatment for Depression or other mood problem;Medication(s)  If access to [facility] Urgent Care was not available, would you have  sought care in the Emergency Department? Yes  Determination of Need Urgent (48 hours)  Options For Referral Other: Comment;BH Urgent Care;Outpatient Therapy;Medication Management  Determination of Need filed? Yes

## 2024-02-04 NOTE — ED Provider Notes (Signed)
 [facility] Continuous Assessment Admission H&P  Date: 02/04/24 Patient Name: Jeremy Peterson MRN: [id number] Chief Complaint: having a hard time with depression   Diagnoses:  Final diagnoses:  Recurrent major depressive disorder, remission status unspecified (HCC)  Suicidal thoughts  Marital problem  Difficulty coping  Anxious appearance    HPI: Jeremy Peterson, 38 y/o male with a history of MDD, and anxiety,  presented to [facility] voluntarily.  Per the patient he has been having a hard time dealing with depression and really bad thoughts.  When asked what or some of the bad thoughts patient stated a better off not being here to fear of death, he also stated he had thoughts about running his car into something.  Per the patient he has been dealing with the stress of finance and also marriage.  Patient stated that he currently stays with his wife and kid, currently works as a Surveyor, [employer].  Per the patient he is currently taking Celexa  which is prescribed by his PCP he started taking in February 2025.  Patient stated he is supposed to see a psychiatrist for evaluation but that has not happened yet.  Patient also stated he is seeing a counselor right now.  According to the patient he got so mad tonight that he punched a wall and does have marks to his knuckles.  When asked how long he has had suicidal thoughts patient stated for years.   Face-to-face evaluation of patient, patient is alert and oriented x 4, speech is clear however very soft spoken.  Patient does appear to be very depressed, withdrawn and very constricted.  Affect is flat congruent with mood.  Patient can become very tearful at times when talking.  Patient endorsed suicidal thoughts but stating he would not urge anyone he had just thought about driving his car into something but not into traffic because he does not want anyone getting hurt.  Patient denies HI, AVH or paranoia.  Patient denies alcohol use, reports he used THC, denies any  other illicit drug use.  Patient attributed to his increased depression and anxiety to financial problems and the marriage problems patient stated that his marriage is that far gone that he does not think is going to be saved.  Patient denies access to guns denied wanting to hurt anyone.  Patient denies any prior suicidal attempts, denies any prior mental hospitalization.  Patient at this present moment does not seem to be influenced by internal stimuli.  Patient however does appear to be having poor coping mechanism dealing with life issues.  Writer discussed with patient the need for admission patient was given the opportunity to ask questions which he refused to ask.  Patient in agreement for admission.  Recommend inpatient admission when a bed becomes available for now patient will be held in observation.  Total Time spent with patient: 20 minutes  Musculoskeletal  Strength & Muscle Tone: within normal limits Gait & Station: normal Patient leans: N/A  Psychiatric Specialty Exam  Presentation General Appearance:  Casual  Eye Contact: Good  Speech: Clear and Coherent  Speech Volume: Decreased  Handedness: Right   Mood and Affect  Mood: Anxious; Depressed  Affect: Congruent   Thought Process  Thought Processes: Coherent  Descriptions of Associations:Intact  Orientation:Full (Time, Place and Person)  Thought Content:Logical    Hallucinations:Hallucinations: None  Ideas of Reference:None  Suicidal Thoughts:Suicidal Thoughts: Yes, Passive SI Passive Intent and/or Plan: With Intent; With Plan  Homicidal Thoughts:Homicidal Thoughts: No   Sensorium  Memory: Immediate Fair  Judgment: Fair  Insight: Fair   Art therapist  Concentration: Fair  Attention Span: Good  Recall: [place] of Knowledge: Good  Language: Good   Psychomotor Activity  Psychomotor Activity: Psychomotor Activity: Normal   Assets  Assets: Desire for  Improvement; Resilience; Social Support   Sleep  Sleep: Sleep: Fair Number of Hours of Sleep: 6   Nutritional Assessment (For OBS and FBC admissions only) Has the patient had a weight loss or gain of 10 pounds or more in the last 3 months?: No Has the patient had a decrease in food intake/or appetite?: No Does the patient have dental problems?: No Does the patient have eating habits or behaviors that may be indicators of an eating disorder including binging or inducing vomiting?: No Has the patient recently lost weight without trying?: 0 Has the patient been eating poorly because of a decreased appetite?: 0 Malnutrition Screening Tool Score: 0    Physical Exam HENT:     Head: Normocephalic.     Nose: Nose normal.  Eyes:     Pupils: Pupils are equal, round, and reactive to light.  Cardiovascular:     Rate and Rhythm: Normal rate.  Pulmonary:     Effort: Pulmonary effort is normal.  Musculoskeletal:        General: Normal range of motion.     Cervical back: Normal range of motion.  Neurological:     General: No focal deficit present.     Mental Status: He is alert.  Psychiatric:        Mood and Affect: Mood normal.        Behavior: Behavior normal.        Thought Content: Thought content normal.        Judgment: Judgment normal.    Review of Systems  Constitutional: Negative.   HENT: Negative.    Eyes: Negative.   Respiratory: Negative.    Cardiovascular: Negative.   Gastrointestinal: Negative.   Genitourinary: Negative.   Musculoskeletal: Negative.   Skin: Negative.   Neurological: Negative.   Psychiatric/Behavioral:  Positive for depression and suicidal ideas. The patient is nervous/anxious.     Blood pressure (!) 143/87, pulse (!) 59, temperature 98.4 F (36.9 C), temperature source Oral, resp. rate 20, SpO2 99%. There is no height or weight on file to calculate BMI.  Past Psychiatric History: MDD, general anxiety.  Is the patient at risk to self? Yes   Has the patient been a risk to self in the past 6 months? Yes .    Has the patient been a risk to self within the distant past? Yes   Is the patient a risk to others? No   Has the patient been a risk to others in the past 6 months? No   Has the patient been a risk to others within the distant past? No   Past Medical History: See chart  Family History: Unknown  Social History: THC  Last Labs:  Admission on 02/04/2024  Component Date Value Ref Range Status   POC Amphetamine UR 02/04/2024 None Detected  NONE DETECTED (Cut Off Level 1000 ng/mL) Final   POC Secobarbital (BAR) 02/04/2024 None Detected  NONE DETECTED (Cut Off Level 300 ng/mL) Final   POC Buprenorphine (BUP) 02/04/2024 None Detected  NONE DETECTED (Cut Off Level 10 ng/mL) Final   POC Oxazepam (BZO) 02/04/2024 None Detected  NONE DETECTED (Cut Off Level 300 ng/mL) Final   POC Cocaine UR 02/04/2024 None Detected  NONE DETECTED (Cut Off Level 300 ng/mL) Final   POC Methamphetamine UR 02/04/2024 None Detected  NONE DETECTED (Cut Off Level 1000 ng/mL) Final   POC Morphine 02/04/2024 None Detected  NONE DETECTED (Cut Off Level 300 ng/mL) Final   POC Methadone UR 02/04/2024 None Detected  NONE DETECTED (Cut Off Level 300 ng/mL) Final   POC Oxycodone  UR 02/04/2024 None Detected  NONE DETECTED (Cut Off Level 100 ng/mL) Final   POC Marijuana UR 02/04/2024 None Detected  NONE DETECTED (Cut Off Level 50 ng/mL) Final  Admission on 11/22/2023, Discharged on 11/22/2023  Component Date Value Ref Range Status   WBC 11/22/2023 12.5 (H)  4.0 - 10.5 K/uL Final   RBC 11/22/2023 5.73  4.22 - 5.81 MIL/uL Final   Hemoglobin 11/22/2023 16.9  13.0 - 17.0 g/dL Final   HCT 16/06/9603 47.3  39.0 - 52.0 % Final   MCV 11/22/2023 82.5  80.0 - 100.0 fL Final   MCH 11/22/2023 29.5  26.0 - 34.0 pg Final   MCHC 11/22/2023 35.7  30.0 - 36.0 g/dL Final   RDW 54/05/8118 12.5  11.5 - 15.5 % Final   Platelets 11/22/2023 213  150 - 400 K/uL Final   nRBC  11/22/2023 0.0  0.0 - 0.2 % Final   Neutrophils Relative % 11/22/2023 92  % Final   Neutro Abs 11/22/2023 11.4 (H)  1.7 - 7.7 K/uL Final   Lymphocytes Relative 11/22/2023 4  % Final   Lymphs Abs 11/22/2023 0.5 (L)  0.7 - 4.0 K/uL Final   Monocytes Relative 11/22/2023 4  % Final   Monocytes Absolute 11/22/2023 0.5  0.1 - 1.0 K/uL Final   Eosinophils Relative 11/22/2023 0  % Final   Eosinophils Absolute 11/22/2023 0.1  0.0 - 0.5 K/uL Final   Basophils Relative 11/22/2023 0  % Final   Basophils Absolute 11/22/2023 0.0  0.0 - 0.1 K/uL Final   Immature Granulocytes 11/22/2023 0  % Final   Abs Immature Granulocytes 11/22/2023 0.05  0.00 - 0.07 K/uL Final   Performed at [facility] [address] [place], [place] [address]   Sodium 11/22/2023 135  135 - 145 mmol/L Final   Potassium 11/22/2023 3.9  3.5 - 5.1 mmol/L Final   Chloride 11/22/2023 99  98 - 111 mmol/L Final   CO2 11/22/2023 23  22 - 32 mmol/L Final   Glucose, Bld 11/22/2023 117 (H)  70 - 99 mg/dL Final   Glucose reference range applies only to samples taken after fasting for at least 8 hours.   BUN 11/22/2023 17  6 - 20 mg/dL Final   Creatinine, Ser 11/22/2023 1.30 (H)  0.61 - 1.24 mg/dL Final   Calcium  11/22/2023 9.3  8.9 - 10.3 mg/dL Final   Total Protein 95/62/1308 8.0  6.5 - 8.1 g/dL Final   Albumin 65/78/4696 4.7  3.5 - 5.0 g/dL Final   AST 29/52/8413 26  15 - 41 U/L Final   ALT 11/22/2023 40  0 - 44 U/L Final   Alkaline Phosphatase 11/22/2023 44  38 - 126 U/L Final   Total Bilirubin 11/22/2023 1.5 (H)  0.0 - 1.2 mg/dL Final   GFR, Estimated 11/22/2023 >60  >60 mL/min Final   Comment: (NOTE) Calculated using the CKD-EPI Creatinine Equation (2021)    Anion gap 11/22/2023 13  5 - 15 Final   Performed at [facility], [address] [place], [place] [address]   Total CK 11/22/2023 117  49 -  397 U/L Final   Performed at [facility], [address] [place], [place] [address]   Troponin I  (High Sensitivity) 11/22/2023 <2  <18 ng/L Final   Comment: (NOTE) Elevated high sensitivity troponin I (hsTnI) values and significant  changes across serial measurements may suggest ACS but many other  chronic and acute conditions are known to elevate hsTnI results.  Refer to the "Links" section for chest pain algorithms and additional  guidance. Performed at [facility], [address] [place], [place] [address]    Color, Urine 11/22/2023 YELLOW  YELLOW Final   APPearance 11/22/2023 CLEAR  CLEAR Final   Specific Gravity, Urine 11/22/2023 1.015  1.005 - 1.030 Final   pH 11/22/2023 6.5  5.0 - 8.0 Final   Glucose, UA 11/22/2023 NEGATIVE  NEGATIVE mg/dL Final   Hgb urine dipstick 11/22/2023 NEGATIVE  NEGATIVE Final   Bilirubin Urine 11/22/2023 NEGATIVE  NEGATIVE Final   Ketones, ur 11/22/2023 80 (A)  NEGATIVE mg/dL Final   Protein, ur 82/95/6213 NEGATIVE  NEGATIVE mg/dL Final   Nitrite 08/65/7846 NEGATIVE  NEGATIVE Final   Leukocytes,Ua 11/22/2023 NEGATIVE  NEGATIVE Final   Comment: Microscopic not done on urines with negative protein, blood, leukocytes, nitrite, or glucose < 500 mg/dL. Performed at [facility] [address] [place], [place] [address]    SARS Coronavirus 2 by RT PCR 11/22/2023 NEGATIVE  NEGATIVE Final   Comment: (NOTE) SARS-CoV-2 target nucleic acids are NOT DETECTED.  The SARS-CoV-2 RNA is generally detectable in upper respiratory specimens during the acute phase of infection. The lowest concentration of SARS-CoV-2 viral copies this assay can detect is 138 copies/mL. A negative result does not preclude SARS-Cov-2 infection and should not be used as the sole basis for treatment or other patient management decisions. A negative result may occur with  improper specimen collection/handling, submission of specimen other than nasopharyngeal swab, presence of viral mutation(s) within the areas targeted by this assay, and inadequate number of  viral copies(<138 copies/mL). A negative result must be combined with clinical observations, patient history, and epidemiological information. The expected result is Negative.  Fact Sheet for Patients:  BloggerCourse.com  Fact Sheet for Healthcare Providers:  SeriousBroker.it  This test is no                          t yet approved or cleared by the [place]  FDA and  has been authorized for detection and/or diagnosis of SARS-CoV-2 by FDA under an Emergency Use Authorization (EUA). This EUA will remain  in effect (meaning this test can be used) for the duration of the COVID-19 declaration under Section 564(b)(1) of the Act, 21 U.S.C.section 360bbb-3(b)(1), unless the authorization is terminated  or revoked sooner.       Influenza A by PCR 11/22/2023 NEGATIVE  NEGATIVE Final   Influenza B by PCR 11/22/2023 NEGATIVE  NEGATIVE Final   Comment: (NOTE) The Xpert Xpress SARS-CoV-2/FLU/RSV plus assay is intended as an aid in the diagnosis of influenza from Nasopharyngeal swab specimens and should not be used as a sole basis for treatment. Nasal washings and aspirates are unacceptable for Xpert Xpress SARS-CoV-2/FLU/RSV testing.  Fact Sheet for Patients: BloggerCourse.com  Fact Sheet for Healthcare Providers: SeriousBroker.it  This test is not yet approved or cleared by the [place]  FDA and has been authorized for detection and/or diagnosis of SARS-CoV-2 by FDA under an Emergency Use Authorization (EUA). This EUA  will remain in effect (meaning this test can be used) for the duration of the COVID-19 declaration under Section 564(b)(1) of the Act, 21 U.S.C. section 360bbb-3(b)(1), unless the authorization is terminated or revoked.     Resp Syncytial Virus by PCR 11/22/2023 NEGATIVE  NEGATIVE Final   Comment: (NOTE) Fact Sheet for  Patients: BloggerCourse.com  Fact Sheet for Healthcare Providers: SeriousBroker.it  This test is not yet approved or cleared by the [place]  FDA and has been authorized for detection and/or diagnosis of SARS-CoV-2 by FDA under an Emergency Use Authorization (EUA). This EUA will remain in effect (meaning this test can be used) for the duration of the COVID-19 declaration under Section 564(b)(1) of the Act, 21 U.S.C. section 360bbb-3(b)(1), unless the authorization is terminated or revoked.  Performed at [facility], [address] [place], [place] [address]    Troponin I (High Sensitivity) 11/22/2023 <2  <18 ng/L Final   Comment: (NOTE) Elevated high sensitivity troponin I (hsTnI) values and significant  changes across serial measurements may suggest ACS but many other  chronic and acute conditions are known to elevate hsTnI results.  Refer to the "Links" section for chest pain algorithms and additional  guidance. Performed at [facility], [address] [place], [place] [address]   Admission on 11/20/2023, Discharged on 11/20/2023  Component Date Value Ref Range Status   Color, Urine 11/19/2023 YELLOW  YELLOW Final   APPearance 11/19/2023 CLEAR  CLEAR Final   Specific Gravity, Urine 11/19/2023 1.010  1.005 - 1.030 Final   pH 11/19/2023 7.0  5.0 - 8.0 Final   Glucose, UA 11/19/2023 NEGATIVE  NEGATIVE mg/dL Final   Hgb urine dipstick 11/19/2023 NEGATIVE  NEGATIVE Final   Bilirubin Urine 11/19/2023 NEGATIVE  NEGATIVE Final   Ketones, ur 11/19/2023 NEGATIVE  NEGATIVE mg/dL Final   Protein, ur 78/29/5621 NEGATIVE  NEGATIVE mg/dL Final   Nitrite 30/86/5784 NEGATIVE  NEGATIVE Final   Leukocytes,Ua 11/19/2023 NEGATIVE  NEGATIVE Final   Comment: Microscopic not done on urines with negative protein, blood, leukocytes, nitrite, or glucose < 500 mg/dL. Performed at [facility], [address] [place], [place] [address]    WBC 11/19/2023 9.3  4.0 - 10.5 K/uL Final   RBC 11/19/2023 5.28  4.22 - 5.81 MIL/uL Final   Hemoglobin 11/19/2023 15.5  13.0 - 17.0 g/dL Final   HCT 52/84/1324 44.4  39.0 - 52.0 % Final   MCV 11/19/2023 84.1  80.0 - 100.0 fL Final   MCH 11/19/2023 29.4  26.0 - 34.0 pg Final   MCHC 11/19/2023 34.9  30.0 - 36.0 g/dL Final   RDW 40/06/2724 12.6  11.5 - 15.5 % Final   Platelets 11/19/2023 242  150 - 400 K/uL Final   nRBC 11/19/2023 0.0  0.0 - 0.2 % Final   Neutrophils Relative % 11/19/2023 57  % Final   Neutro Abs 11/19/2023 5.3  1.7 - 7.7 K/uL Final   Lymphocytes Relative 11/19/2023 36  % Final   Lymphs Abs 11/19/2023 3.3  0.7 - 4.0 K/uL Final   Monocytes Relative 11/19/2023 5  % Final   Monocytes Absolute 11/19/2023 0.5  0.1 - 1.0 K/uL Final   Eosinophils Relative 11/19/2023 2  % Final   Eosinophils Absolute 11/19/2023 0.2  0.0 - 0.5 K/uL Final   Basophils Relative 11/19/2023 0  % Final   Basophils Absolute 11/19/2023 0.0  0.0 - 0.1 K/uL Final   Immature Granulocytes 11/19/2023 0  %  Final   Abs Immature Granulocytes 11/19/2023 0.03  0.00 - 0.07 K/uL Final   Performed at [facility], [address] [place], [place] [address]   Sodium 11/19/2023 136  135 - 145 mmol/L Final   Potassium 11/19/2023 3.6  3.5 - 5.1 mmol/L Final   Chloride 11/19/2023 101  98 - 111 mmol/L Final   CO2 11/19/2023 24  22 - 32 mmol/L Final   Glucose, Bld 11/19/2023 85  70 - 99 mg/dL Final   Glucose reference range applies only to samples taken after fasting for at least 8 hours.   BUN 11/19/2023 8  6 - 20 mg/dL Final   Creatinine, Ser 11/19/2023 0.89  0.61 - 1.24 mg/dL Final   Calcium  11/19/2023 9.2  8.9 - 10.3 mg/dL Final   Total Protein 29/56/2130 7.6  6.5 - 8.1 g/dL Final   Albumin 86/57/8469 4.7  3.5 - 5.0 g/dL Final   AST 62/95/2841 24  15 - 41 U/L Final   ALT 11/19/2023 37  0 - 44 U/L Final   Alkaline Phosphatase 11/19/2023 42  38 - 126 U/L Final   Total  Bilirubin 11/19/2023 0.8  0.0 - 1.2 mg/dL Final   GFR, Estimated 11/19/2023 >60  >60 mL/min Final   Comment: (NOTE) Calculated using the CKD-EPI Creatinine Equation (2021)    Anion gap 11/19/2023 11  5 - 15 Final   Performed at [facility], [address] [place], [place] [address]   Lipase 11/19/2023 28  11 - 51 U/L Final   Performed at [facility], [address] [place], [place] [address]    Allergies: Bactrim  [sulfamethoxazole -trimethoprim ]  Medications:  Facility Ordered Medications  Medication   acetaminophen  (TYLENOL ) tablet 650 mg   alum & mag hydroxide-simeth (MAALOX/MYLANTA) 200-200-20 MG/5ML suspension 30 mL   magnesium hydroxide (MILK OF MAGNESIA) suspension 30 mL   OLANZapine zydis (ZYPREXA) disintegrating tablet 5 mg   OLANZapine (ZYPREXA) injection 5 mg   OLANZapine (ZYPREXA) injection 10 mg   [COMPLETED] cloNIDine (CATAPRES) tablet 0.1 mg   PTA Medications  Medication Sig   gabapentin  (NEURONTIN ) 300 MG capsule Take 2 capsules (600 mg total) by mouth 3 (three) times daily as needed (Anxiety).   rosuvastatin  (CRESTOR ) 20 MG tablet Take 1 tablet (20 mg total) by mouth daily.   tamsulosin  (FLOMAX ) 0.4 MG CAPS capsule Take 1 capsule (0.4 mg total) by mouth daily.   ondansetron  (ZOFRAN -ODT) 4 MG disintegrating tablet Take 1 tablet (4 mg total) by mouth every 8 (eight) hours as needed for nausea or vomiting.   doxycycline  (VIBRAMYCIN ) 100 MG capsule Take 1 capsule (100 mg total) by mouth 2 (two) times daily.   clindamycin  (CLINDAGEL) 1 % gel Apply topically 2 (two) times daily.   citalopram  (CELEXA ) 20 MG tablet Take 1 tablet (20 mg total) by mouth daily.      Medical Decision Making  Observation unit recommend inpatient admission when a bed becomes available    Recommendations  Based on my evaluation the patient does not appear to have an emergency medical condition.  Dorthea Gauze, NP 02/04/24  4:14 AM

## 2024-02-04 NOTE — ED Notes (Signed)
 Patient is a 38 y.o. male admitted to observation unit for worsening depression and SI, with a plan to "drive his car into something". Pt is very flat, depressed, tearful at times. Patient t is observed pacing and medications for BP, anxiety and irritability given. Patient said to the nurse "I don't know what to say" when asked what brought him to the hospital. Patient is tearful, reports marital issues and feeling as if he is on an emotional roller coaster. Pt reports hx of anxiety and is prescribed Celexa . Pt is compliant with medication regimen. Pt also reports past attempts to overdose on medication in his 53s. Pt denies impatient treatment at that time. Skin assessment completed, pt observed with an abrasion on his hand due to punching a wall earlier. Pt currently denies HI,AVH and alcohol use. Will continue to monitor for safety

## 2024-04-20 ENCOUNTER — Telehealth: Payer: Self-pay

## 2024-04-20 NOTE — Telephone Encounter (Signed)
 Called pt was advised we will see him tomorrow at 415 e-visit.

## 2024-04-20 NOTE — Telephone Encounter (Signed)
 Pt scheduled for tomorrow E-visit 415.

## 2024-04-20 NOTE — Telephone Encounter (Signed)
 Copied from CRM #[id number]. Topic: General - Call Back - No Documentation >> Apr 20, 2024  2:05 PM Frederich PARAS wrote: Reason for CRM: pt needs a call back frm pcp, Pt says it is persona and he would rather speak to provider. Please call back pt at [phone]

## 2024-04-21 ENCOUNTER — Telehealth: Admitting: Family Medicine

## 2024-04-21 ENCOUNTER — Encounter: Payer: Self-pay | Admitting: Family Medicine

## 2024-04-21 DIAGNOSIS — F1120 Opioid dependence, uncomplicated: Secondary | ICD-10-CM | POA: Diagnosis not present

## 2024-04-21 DIAGNOSIS — F411 Generalized anxiety disorder: Secondary | ICD-10-CM | POA: Diagnosis not present

## 2024-04-21 NOTE — Progress Notes (Signed)
 No chief complaint on file.   Subjective Jeremy Peterson presents for f/u anxiety/depression. We are interacting via web portal for an electronic face-to-face visit. I verified patient's ID using 2 identifiers. Patient agreed to proceed with visit via this method. Patient is at home, I am at office. Patient and I are present for visit.   Pt is currently being treated with Celexa  30 mg/d.  Reports doing OK since treatment. Did not take Atarax  as it was too sedating.    5 mo ago, he started taking Hydroxy7. He was taking 200 mg/d and weaned down to 100 mg/d. He no longer gets a buzz, but if he doesn't take it, he feels terrible. He is wondering what he needs to do.   Past Medical History:  Diagnosis Date   Anxiety    Concussion    multiple from college wrestling, no residual   DEPRESSION    History of kidney stones    Migraines    Nephrolithiasis    PAC (premature atrial contraction)    no cardiology no treatment needed last pac few yrs ago   Allergies as of 04/21/2024       Reactions   Bactrim  [sulfamethoxazole -trimethoprim ] Nausea And Vomiting        Medication List        Accurate as of April 21, 2024  5:02 PM. If you have any questions, ask your nurse or doctor.          STOP taking these medications    hydrOXYzine  10 MG tablet Commonly known as: ATARAX  Stopped by: Jeremy Peterson       TAKE these medications    citalopram  20 MG tablet Commonly known as: CELEXA  Take 1.5 tablets (30 mg total) by mouth daily.   rosuvastatin  20 MG tablet Commonly known as: Crestor  Take 1 tablet (20 mg total) by mouth daily.        Exam No conversational dyspnea Age appropriate judgment and insight Nml affect and mood  Assessment and Plan  GAD (generalized anxiety disorder)  Uncomplicated opioid dependence (HCC)  Chronic, adverse effect of med. Stop Atarax . Start Propranolol 10-20 mg TID prn. Cont Celexa . Appreciate psych.  Refer addiction. Will reach  out to him to update him on either reaching out to his own psych or going thru w our referral. I did speak with the pharmacy team who brought up Suboxone  which I do not have experience in prescribing.  The patient voiced understanding and agreement to the plan.  I spent 40 min w the pt discussing the above plans in addition to reviewing his chart, researching his substance situation, and coordinating care with the pharmacy team.   Jeremy [place], DO 04/21/24 5:02 PM

## 2024-04-22 ENCOUNTER — Other Ambulatory Visit: Payer: Self-pay | Admitting: Family Medicine

## 2024-04-22 ENCOUNTER — Telehealth: Payer: Self-pay

## 2024-04-22 MED ORDER — BUPRENORPHINE HCL-NALOXONE HCL 8-2 MG SL FILM: 1.0000 | ORAL_FILM | Freq: Every day | SUBLINGUAL | 0 refills | Status: DC

## 2024-04-22 NOTE — Telephone Encounter (Signed)
 Copied from CRM #[id number]. Topic: General - Other >> Apr 22, 2024 11:28 AM Deaijah H wrote: Reason for CRM: Patient called in to get Dr. Izella contact information due to Dr. Frann advising him to reach out. Could not locate number on google. Please call [phone] to provide information  Contact information sent to pt, for him to reach to  Dr.Hoang, Ismael NOVAK, MD [address] [place], [place] [address] Phone: [phone]   Fax: [phone]

## 2024-04-29 ENCOUNTER — Other Ambulatory Visit: Payer: Self-pay | Admitting: Family Medicine

## 2024-04-29 MED ORDER — BUPRENORPHINE HCL-NALOXONE HCL 8-2 MG SL FILM
1.0000 | ORAL_FILM | Freq: Every day | SUBLINGUAL | 0 refills | Status: DC
Start: 2024-04-29 — End: 2024-05-25

## 2024-05-18 NOTE — Progress Notes (Signed)
 Psychiatric Initial Adult Assessment  Patient Identification: Jeremy Peterson MRN:  [id number] Date of Evaluation:  05/18/2024  Assessment: Patient reports periods of depression that occur for weeks in the context of his psychosocial stressors.  He also reports difficulty staying asleep, anhedonia, feelings of hopelessness, poor energy and concentration, poor appetite, and previous thoughts of SI.  Patient also notes periods of 3 days - 1 week of persistently elevated energy and irritability outside substance use in which she would also have minimal sleep of 3 hours nightly, increased activity of deep cleaning, impulsivity of overspending, and flight of ideas.  At this time, differential includes MDD versus bipolar 2 disorder.  Patient also meets criteria for GAD with panic attacks as he reported persistent and uncontrollable anxiety with restlessness, irritability, and muscle tension secondary to the anxiety.  He also reports panic attacks which reassuringly has improved.    Patient notes moderate benefit with Celexa  regarding his anxiety and depression.  We will start Abilify  for mood stabilization and if he continues to improve, we can consider tapering off Celexa  and increasing Abilify  if we become more certain regarding a true diagnosis of bipolar disorder.  We will also start propranolol  to aid with physical symptoms of anxiety and patient is motivated to restart therapy.  I discussed the risks, benefits, and possible adverse effects of these medications.    Patient also has a history of several month use of 7-OH and has stopped the substance and is now being managed on Suboxone .  Patient is concerned of long-term adverse effects of Suboxone  and is wanting to start the taper.  We will decrease his Suboxone  to 6-1.5 mg.  I do not anticipate this being a long taper.  As patient has only been on Suboxone  for a little over 1 month.  Will monitor for cravings and withdrawal symptoms.  We will follow-up in  about 2 weeks.  Plan:  # MDD vs bipolar 2 disorder - Start Abilify  2.5 mg daily for 7 days and if tolerant increase to 5 mg  -Obtain CBC, CMP, TSH, A1c, lipid panel in next visit - Continue Celexa  30 mg daily (pt taking nightly) - Recommend restarting therapy  # GAD with panic attacks - Start propranolol  10 mg TID PRN  # Previous opioid use (7-OH substance) - Decrease suboxone  to 6-1.5 mg  - Obtain UDS in next appointment  - PDMP checked, filling prescription appropriately  Patient was given contact information for behavioral health clinic and was instructed to call 911 for emergencies.   Identifying Information: Jeremy Peterson is a 38 y.o. male with a history of depression and GAD who presents in person to [facility] for establishment of care.    Subjective:  History of Present Illness:    Patient seen alone. He was previously taking Celexa  30 mg, and atarax  was discontinued due to being overly sedating. Med management by Dr. Frann PCP.  Goals for care: working on anxiety and depression.  Patient reports having depression since he was a teenager. He notes having periods of depression that occurred about 2 weeks at a time. He reports varied sleep, some nights having difficulty staying asleep. He reports having low pleasure in his day. He states that he previously used to be into nature and backpacking but no longer has been doing this as much. He states a typical day is taking care of the children and working. His wife is about to start her masters in psychology. He reports feelings of  hopelessness that he notices about weekly but he states this is improving and feels grateful for his family. He notes low energy consistently. He reports poor concentration at home and at work. He reports varied appetite, reporting losing 35 pounds since the beginning of the year. He denies trying to lose the weight. He states that he lived in his friends house for about 4  months prior to moving back in his house because his wife wanted him to be less erratic. He denies current SI. He states the most recent time that he had active SI was 3 months ago and he did not act on the thoughts because of his children. We discussed a safety plan including warning signs, coping strategies, and possible people to contact and resources to utilize if SI worsen.   Patient denies HI currently and in the past. He denies AVH.    Anxiety Symptoms: Generalized, rating a 6-7/10, constant and difficult to control. Reporting restlessness, irritability, and muscle tension due to anxiety, States improvement in frequency in panic attacks. He notes having panic attack about once a month. He notes having feelings of SOB, chest pain, impending sense of doom during a panic attack. States coping skills is reframing his thoughts, deep breathing, closing his eyes, going for a walk.  Manic Symptoms: reports 3 days to a week of persistently elevated energy occurring about monthly with increased activity of deep cleaning the home, impulsivity (overspend on games), flight of ideas, minimal sleep (3-4 hours nightly), states persistent irritability  Psychosis Symptoms: denies Hx of trauma/abuse: denies  Past Psychiatric History:  Diagnoses: MDD, GAD Previous medications: Celexa  30 mg, gabapentin  (anxiety and neuropathy, states it was helpful and stopped because he prefers not be on multiple medication) Previous psychiatrist:  states in his teens  Previous therapist: Toya Ludwig- most recently in 02/2024, saw weekly for months.   Hospitalizations: denies Suicide attempts: 2 prior suicide attempts both in his 20's via overdosing SIB: denies Current access to guns: denies  Hx of violence towards others: denies  Substance use:  Tobacco: yes, vaping, daily, 1 puff hourly Alcohol: denies Marijuana: stopped in May 2025, previously used daily Other illicit substances: denies  Previously used 7-OH,  hydroxy 7 for 7 months which was obtained from the vape store. Stopped in August and was started on Suboxone .  Past Medical History:  Dx: denies  Medications: denies PCP: Dr. 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 Relationship: married for 7 years Children: 3 daughters and 1 son in the home (ages 33, 30, 19, 4) 1 daughter that lives with ex wife and one step son who lives out Support: mother and wife Legal History: denies  Past Medical History:  Past Medical History:  Diagnosis Date   Anxiety    Concussion    multiple from college wrestling, no residual   DEPRESSION    History of kidney stones    Migraines    Nephrolithiasis    PAC (premature atrial contraction)    no cardiology no treatment needed last pac few yrs ago    Past Surgical History:  Procedure Laterality Date   APPENDECTOMY  2011   CHOLECYSTECTOMY  2018   SURGERY SCROTAL / TESTICULAR  age 5   right   VASECTOMY Bilateral 03/18/2020   Procedure: VASECTOMY;  Surgeon: Nieves Cough, MD;  Location: [facility];  Service: Urology;  Laterality: Bilateral;    Family History:  Family History  Problem Relation Age of Onset   Heart disease Paternal Grandfather    Hyperlipidemia Father    Hypertension Father    Cancer Maternal Grandfather     Social History   Socioeconomic History   Marital status: Married    Spouse name: Not on file   Number of children: 1   Years of education: Not on file   Highest education level: 12th grade  Occupational History   Not on file  Tobacco Use   Smoking status: Former    Current packs/day: 0.00    Average packs/day: 1 pack/day for 5.0 years (5.0 ttl pk-yrs)    Types: Cigarettes    Start date: 09/04/2007    Quit date:  09/03/2012    Years since quitting: 11.7   Smokeless tobacco: Former  Building services engineer status: Former   Devices: yrs ago quit  Substance and Sexual Activity   Alcohol use: No   Drug use: Not Currently    Types: Benzodiazepines    Comment: CBD for anxiety   Sexual activity: Not on file  Other Topics Concern   Not on file  Social History Narrative   Not on file   Social Drivers of Health   Financial Resource Strain: High Risk (04/21/2024)   Overall Financial Resource Strain (CARDIA)    Difficulty of Paying Living Expenses: Hard  Food Insecurity: Food Insecurity Present (04/21/2024)   Hunger Vital Sign    Worried About Running Out of Food in the Last Year: Sometimes true    Ran Out of Food in the Last Year: Never true  Transportation Needs: No Transportation Needs (04/21/2024)   PRAPARE - Administrator, Civil Service (Medical): No    Lack of Transportation (Non-Medical): No  Physical Activity: Insufficiently Active (04/21/2024)   Exercise Vital Sign    Days of Exercise per Week: 2 days    Minutes of Exercise per Session: 60 min  Stress: Stress Concern Present (04/21/2024)   [employer] of Occupational Health - Occupational Stress Questionnaire    Feeling of Stress: Very much  Social Connections: Moderately Isolated (04/21/2024)   Social Connection and Isolation Panel    Frequency of Communication with Friends and Family: More than three times a week    Frequency of Social Gatherings with Friends and Family: Once a week    Attends Religious Services: 1 to 4 times per year    Active Member of [employer] or Organizations: No    Attends Engineer, structural: Not on file    Marital Status: Divorced    Allergies:  Allergies  Allergen Reactions   Bactrim  [Sulfamethoxazole -Trimethoprim ] Nausea And Vomiting    Current Medications: Current Outpatient Medications  Medication Sig Dispense Refill   Buprenorphine  HCl-Naloxone  HCl (SUBOXONE ) 8-2 MG FILM Place 1  Film under the tongue daily. 30 each 0   citalopram  (CELEXA ) 20 MG tablet Take 1.5 tablets (30 mg total) by mouth daily. 30 tablet 0   rosuvastatin  (CRESTOR ) 20 MG tablet Take 1 tablet (20 mg total) by mouth daily. 90 tablet 3   No current facility-administered medications for this visit.    Objective:  Psychiatric Specialty Exam General Appearance: appears at stated age, casually dressed and groomed   Behavior: pleasant and cooperative   Psychomotor Activity: no psychomotor agitation or retardation noted   Eye Contact: fair  Speech: normal amount, tone, volume and fluency    Mood: euthymic  Affect: congruent, pleasant and interactive   Thought Process: linear, goal directed, no circumstantial or tangential thought process noted, no racing thoughts or flight of ideas  Descriptions of Associations: intact   Thought Content Hallucinations: denies AH, VH , does not appear responding to stimuli  Delusions: no paranoia, delusions of control, grandeur, ideas of reference, thought broadcasting, and magical thinking  Suicidal Thoughts: denies SI, intention, plan  Homicidal Thoughts: denies HI, intention, plan   Alertness/Orientation: alert and fully oriented   Insight: fair Judgment: fair  Memory: intact   Executive Functions  Concentration: intact  Attention Span: fair  Recall: intact  Fund of Knowledge: fair   Physical Exam  General: Pleasant, well-appearing. No acute distress. Pulmonary: Normal effort. No wheezing or rales. Skin: No obvious rash or lesions. Neuro: A&Ox3.No focal deficit.   Review of Systems  No reported symptoms  Metabolic Disorder Labs: No results found for: HGBA1C, MPG No results found for: PROLACTIN Lab Results  Component Value Date   CHOL 142 01/21/2023   TRIG 88.0 01/21/2023   HDL 48.90 01/21/2023   CHOLHDL 3 01/21/2023   VLDL 17.6 01/21/2023   LDLCALC 75 01/21/2023   LDLCALC 169 (H) 12/04/2022   Lab Results  Component Value  Date   TSH 1.291 02/04/2024    Therapeutic Level Labs: No results found for: LITHIUM No results found for: CBMZ No results found for: VALPROATE  Screenings:  GAD-7    Flowsheet Row Office Visit from 10/22/2023 in [facility] at [facility]  Total GAD-7 Score 3   PHQ2-9    Flowsheet Row Office Visit from 10/22/2023 in [facility] Office Visit from 10/19/2022 in [facility] Care at [facility] Office Visit from 03/14/2022 in [facility] Primary Care at [facility]  PHQ-2 Total Score 2 0 3  PHQ-9 Total Score 2 2 10    Flowsheet Row ED from 02/04/2024 in [facility] ED from 11/22/2023 in [facility] Emergency Department at [facility] ED from 11/20/2023 in [facility] Department at [facility]  C-SSRS RISK CATEGORY Moderate Risk No Risk No Risk    Collaboration of Care: Case discussed with attending, see attending's attestation for additional information.  Ismael Franco, MD PGY-3 Psychiatry Resident

## 2024-05-25 ENCOUNTER — Ambulatory Visit (HOSPITAL_BASED_OUTPATIENT_CLINIC_OR_DEPARTMENT_OTHER): Admitting: Psychiatry

## 2024-05-25 VITALS — BP 115/73 | Ht 70.0 in | Wt 197.8 lb

## 2024-05-25 DIAGNOSIS — F1110 Opioid abuse, uncomplicated: Secondary | ICD-10-CM | POA: Diagnosis not present

## 2024-05-25 DIAGNOSIS — F331 Major depressive disorder, recurrent, moderate: Secondary | ICD-10-CM | POA: Insufficient documentation

## 2024-05-25 DIAGNOSIS — F411 Generalized anxiety disorder: Secondary | ICD-10-CM | POA: Diagnosis not present

## 2024-05-25 DIAGNOSIS — F39 Unspecified mood [affective] disorder: Secondary | ICD-10-CM | POA: Diagnosis not present

## 2024-05-25 MED ORDER — CITALOPRAM HYDROBROMIDE 20 MG PO TABS: 30.0000 mg | ORAL_TABLET | Freq: Every day | ORAL | 0 refills | Status: DC

## 2024-05-25 MED ORDER — BUPRENORPHINE HCL-NALOXONE HCL 4-1 MG SL FILM: 4.0000 mg | ORAL_FILM | Freq: Every day | SUBLINGUAL | 0 refills | Status: DC

## 2024-05-25 MED ORDER — ARIPIPRAZOLE 5 MG PO TABS: ORAL_TABLET | ORAL | 1 refills | Status: AC

## 2024-05-25 MED ORDER — PROPRANOLOL HCL 10 MG PO TABS: 10.0000 mg | ORAL_TABLET | Freq: Three times a day (TID) | ORAL | 0 refills | Status: DC | PRN

## 2024-05-25 MED ORDER — BUPRENORPHINE HCL-NALOXONE HCL 2-0.5 MG SL FILM: 2.0000 mg | ORAL_FILM | Freq: Every day | SUBLINGUAL | 0 refills | Status: DC

## 2024-05-25 NOTE — Addendum Note (Signed)
 Addended by: CARVIN CROCK on: 05/25/2024 12:32 PM   Modules accepted: Level of Service

## 2024-05-26 ENCOUNTER — Telehealth (HOSPITAL_COMMUNITY): Payer: Self-pay | Admitting: *Deleted

## 2024-05-26 NOTE — Telephone Encounter (Signed)
 Pt to clarify Suboxone  dose as pharmacy only filled the 2-0.5 mg script. Writer spoke with pharmacist [facility] who said they will order the 4 mg and it should be in store tomorrow. Pt advised.

## 2024-05-29 ENCOUNTER — Other Ambulatory Visit: Payer: Self-pay | Admitting: Family Medicine

## 2024-06-01 NOTE — Progress Notes (Cosign Needed Addendum)
 Psychiatric Adult Assessment Progress Note  Patient Identification: Jeremy Peterson MRN:  [id number] Date of Evaluation:  06/10/2024  Assessment: In the prior visit, we started Abilify  and propranolol  and started suboxone  taper. Today, patient is doing well, attributing this to his medications and also lifestyle changes, soon to start a new job. He unfortunately has not gotten his Abilify  due to insurance and there was issues obtaining his suboxone  previously. Discussed that we will obtain blood work when he starts the Abilify . Will decrease Suboxone  to 6 mg as he notes that when he did decrease it from 8 mg to 6 mg, he managed fairly. F/u in 3 weeks to assess.   Plan:  # MDD vs bipolar 2 disorder - Start Abilify  2.5 mg daily for 7 days and if tolerant increase to 5 mg  -Obtain CBC, CMP, TSH, A1c, lipid panel when he starts the medication, pending insurance approval - Continue Celexa  30 mg daily (pt taking nightly)  # GAD with panic attacks - Propranolol  10 mg TID PRN  # Previous opioid use (7-OH substance) - Decrease suboxone  to 6-1.5 mg  - UDS pending  - PDMP checked, filling prescription appropriately  Patient was given contact information for behavioral health clinic and was instructed to call 911 for emergencies.   Identifying Information: Jeremy Peterson is a 38 y.o. male with a history of depression and GAD who presents in person to [facility] for establishment of care. Patient started on suboxone  taper.   Subjective:  Patient seen alone.  Patient reports feeling fair today. He states that he was taking 6 mg of suboxone  through three 2 mg tablets but ran out and then picked up the 4 mg suboxone  but decreasing it to 4 mg was too much so he started taking two of the 4 mg. Discussed that we will decrease to 6 mg suboxone  now and reassess in 3 weeks. 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   Regarding psychiatric symptoms, he notes improvement with anxiety attributing this to the propranolol . He states it was helpful with his recent job interview and social situations. He takes propranolol  about twice daily. He states that he has not started Abilify  due to insurance barrier. State the clinic nurse spoke with him and is working on it. Patient reports the following adverse effects: initially tiredness on propranolol .   Patient reports good sleep. Patient reports poor appetite, stating he eats snacks throughout the day and attributing this to being cautious about his blood sugar.   Patient denies current SI, HI, and AVH.    Past Psychiatric History:  Diagnoses: MDD, GAD Previous medications: Celexa  30 mg, gabapentin  (anxiety and neuropathy, states it was helpful and stopped because he prefers not be on multiple medication) Previous psychiatrist:  states in his teens  Previous therapist: Toya Ludwig- most recently in 02/2024, saw weekly for months.   Hospitalizations: denies Suicide attempts: 2 prior suicide attempts both in his 20's via overdosing SIB: denies Current access to guns: denies  Hx of violence towards others: denies  Substance use:  Tobacco: yes, vaping, daily, 1 puff hourly Alcohol: denies Marijuana: stopped in May 2025, previously used daily Other illicit substances: denies  Previously used 7-OH, hydroxy 7 for 7 months which was obtained from the vape store. Stopped in August and was started on Suboxone .  Past Medical History:  Dx: denies  Medications: denies PCP: Dr. 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 Relationship: married for 7  years Children: 3 daughters and 1 son in the home (ages 65, 3, 58, 49) 1 daughter that lives with ex wife and one step son who lives out Support: mother and wife Legal History: denies  Past Medical History:  Past Medical History:  Diagnosis Date   Anxiety    Concussion    multiple from college wrestling, no residual   DEPRESSION    History of kidney stones    Migraines    Nephrolithiasis    PAC (premature atrial contraction)    no cardiology no treatment needed last pac few yrs ago    Past Surgical History:  Procedure Laterality Date   APPENDECTOMY  2011   CHOLECYSTECTOMY  2018   SURGERY SCROTAL / TESTICULAR  age 22   right   VASECTOMY Bilateral 03/18/2020   Procedure: VASECTOMY;  Surgeon: Nieves Cough, MD;  Location: [facility];  Service: Urology;  Laterality: Bilateral;    Family History:  Family History  Problem Relation Age of Onset   Heart disease Paternal Grandfather    Hyperlipidemia Father    Hypertension Father    Cancer Maternal Grandfather     Social History   Socioeconomic History   Marital status: Married    Spouse name: Not on file   Number of children: 1   Years of education: Not on file   Highest education level: 12th grade  Occupational History   Not on file  Tobacco Use   Smoking status: Former    Current packs/day: 0.00    Average packs/day: 1 pack/day for 5.0 years (5.0 ttl pk-yrs)    Types: Cigarettes    Start date: 09/04/2007    Quit date: 09/03/2012    Years since quitting: 11.7   Smokeless tobacco: Former  Building services engineer status: Former   Devices: yrs ago quit  Substance and Sexual Activity   Alcohol use: No   Drug use: Not Currently    Types: Benzodiazepines    Comment: CBD for anxiety   Sexual activity: Not on file  Other Topics Concern   Not on file  Social History Narrative   Not on file   Social Drivers of Health   Financial Resource Strain: High Risk (04/21/2024)   Overall Financial Resource Strain  (CARDIA)    Difficulty of Paying Living Expenses: Hard  Food Insecurity: Food Insecurity Present (04/21/2024)   Hunger Vital Sign    Worried About Running Out of Food in the Last Year: Sometimes true    Ran Out of Food in the Last Year: Never true  Transportation Needs: No Transportation Needs (04/21/2024)   PRAPARE - Administrator, Civil Service (Medical): No    Lack of Transportation (Non-Medical): No  Physical Activity: Insufficiently Active (04/21/2024)   Exercise Vital Sign    Days of Exercise per Week: 2 days    Minutes of Exercise per Session: 60 min  Stress: Stress Concern Present (04/21/2024)   [employer] of Occupational Health - Occupational Stress Questionnaire    Feeling of Stress: Very much  Social Connections: Moderately Isolated (04/21/2024)   Social Connection and Isolation Panel    Frequency of Communication with  Friends and Family: More than three times a week    Frequency of Social Gatherings with Friends and Family: Once a week    Attends Religious Services: 1 to 4 times per year    Active Member of [employer] or Organizations: No    Attends Engineer, structural: Not on file    Marital Status: Divorced    Allergies:  Allergies  Allergen Reactions   Bactrim  [Sulfamethoxazole -Trimethoprim ] Nausea And Vomiting    Current Medications: Current Outpatient Medications  Medication Sig Dispense Refill   Buprenorphine  HCl-Naloxone  HCl (SUBOXONE ) 2-0.5 MG FILM Place 6 mg of opioid under the tongue daily at 2 PM. 40 each 0   ARIPiprazole  (ABILIFY ) 5 MG tablet Take 0.5 tablets (2.5 mg total) by mouth daily for 7 days, THEN 1 tablet (5 mg total) daily. 15 tablet 1   citalopram  (CELEXA ) 20 MG tablet Take 1.5 tablets (30 mg total) by mouth daily. 135 tablet 0   propranolol  (INDERAL ) 10 MG tablet Take 1 tablet (10 mg total) by mouth 3 (three) times daily as needed. 180 tablet 0   rosuvastatin  (CRESTOR ) 20 MG tablet Take 1 tablet (20 mg total) by mouth  daily. 90 tablet 3   No current facility-administered medications for this visit.    Objective:  Psychiatric Specialty Exam: General Appearance: appears at stated age, casually dressed and groomed   Behavior: pleasant and cooperative   Psychomotor Activity: no psychomotor agitation or retardation noted   Eye Contact: fair  Speech: normal amount, volume and fluency    Mood: euthymic  Affect: congruent, pleasant and interactive   Thought Process: linear, goal directed, no circumstantial or tangential thought process noted, no racing thoughts or flight of ideas  Descriptions of Associations: intact   Thought Content Hallucinations: denies AH, VH , does not appear responding to stimuli  Delusions: no paranoia, delusions of control, grandeur, ideas of reference, thought broadcasting, and magical thinking  Suicidal Thoughts: denies SI, intention, plan  Homicidal Thoughts: denies HI, intention, plan   Alertness/Orientation: alert and fully oriented   Insight: fair Judgment: fair  Memory: intact   Executive Functions  Concentration: intact  Attention Span: fair  Recall: intact  Fund of Knowledge: fair   Physical Exam  General: Pleasant, well-appearing. No acute distress. Pulmonary: Normal effort. No wheezing or rales. Skin: No obvious rash or lesions. Neuro: A&Ox3.No focal deficit.  Review of Systems  No reported symptoms   Metabolic Disorder Labs: No results found for: HGBA1C, MPG No results found for: PROLACTIN Lab Results  Component Value Date   CHOL 142 01/21/2023   TRIG 88.0 01/21/2023   HDL 48.90 01/21/2023   CHOLHDL 3 01/21/2023   VLDL 17.6 01/21/2023   LDLCALC 75 01/21/2023   LDLCALC 169 (H) 12/04/2022   Lab Results  Component Value Date   TSH 1.291 02/04/2024    Therapeutic Level Labs: No results found for: LITHIUM No results found for: CBMZ No results found for: VALPROATE  Screenings:  GAD-7    Flowsheet Row Office Visit  from 10/22/2023 in [facility] at [facility]  Total GAD-7 Score 3   PHQ2-9    Flowsheet Row Office Visit from 10/22/2023 in [facility] Primary Care at [facility] Office Visit from 10/19/2022 in [facility] Primary Care at [facility] Office Visit from 03/14/2022 in [facility] Care at [facility]  PHQ-2 Total Score 2 0 3  PHQ-9 Total Score 2 2 10  Flowsheet Row ED from 02/04/2024 in [facility] ED from 11/22/2023 in [facility] Department at [facility] ED from 11/20/2023 in [facility] Emergency Department at [facility]  C-SSRS RISK CATEGORY Moderate Risk No Risk No Risk    Collaboration of Care: Case discussed with attending, see attending's attestation for additional information.  Ismael Franco, MD PGY-3 Psychiatry Resident

## 2024-06-10 ENCOUNTER — Ambulatory Visit (HOSPITAL_BASED_OUTPATIENT_CLINIC_OR_DEPARTMENT_OTHER): Admitting: Psychiatry

## 2024-06-10 VITALS — BP 105/61 | Wt 197.6 lb

## 2024-06-10 DIAGNOSIS — F411 Generalized anxiety disorder: Secondary | ICD-10-CM

## 2024-06-10 DIAGNOSIS — Z5181 Encounter for therapeutic drug level monitoring: Secondary | ICD-10-CM

## 2024-06-10 DIAGNOSIS — F331 Major depressive disorder, recurrent, moderate: Secondary | ICD-10-CM | POA: Diagnosis not present

## 2024-06-10 DIAGNOSIS — F1110 Opioid abuse, uncomplicated: Secondary | ICD-10-CM

## 2024-06-10 DIAGNOSIS — F39 Unspecified mood [affective] disorder: Secondary | ICD-10-CM

## 2024-06-10 MED ORDER — PROPRANOLOL HCL 10 MG PO TABS: 10.0000 mg | ORAL_TABLET | Freq: Three times a day (TID) | ORAL | 0 refills | Status: DC | PRN

## 2024-06-10 MED ORDER — CITALOPRAM HYDROBROMIDE 20 MG PO TABS: 30.0000 mg | ORAL_TABLET | Freq: Every day | ORAL | 0 refills | Status: DC

## 2024-06-10 MED ORDER — SUBOXONE 2-0.5 MG SL FILM: 6.0000 mg | ORAL_FILM | Freq: Every day | SUBLINGUAL | 0 refills | Status: DC

## 2024-06-13 LAB — URINE DRUG PANEL 7
Amphetamines, Urine: NEGATIVE ng/mL
Barbiturate Quant, Ur: NEGATIVE ng/mL
Benzodiazepine Quant, Ur: NEGATIVE ng/mL
Cocaine (Metab.): NEGATIVE ng/mL
Creatinine, Urine: 26.4 mg/dL (ref 20.0–300.0)
Nitrite Urine, Quantitative: NEGATIVE ug/mL
OPIATE SCREEN URINE: NEGATIVE ng/mL
PCP Quant, Ur: NEGATIVE ng/mL
pH, Urine: 7.7 (ref 4.5–8.9)

## 2024-06-13 LAB — CANNABINOID CONFIRMATION, UR: CANNABINOIDS: NEGATIVE

## 2024-06-16 ENCOUNTER — Telehealth (HOSPITAL_COMMUNITY): Payer: Self-pay | Admitting: *Deleted

## 2024-06-16 NOTE — Telephone Encounter (Signed)
Great. Thanks for letting me know.

## 2024-06-16 NOTE — Telephone Encounter (Signed)
 Pt called to advise that he has not been able to fill any of the scripts that were sent in on 06/10/24. Writer spoke with pharmacy and they have a question about the Suboxone  dose, should it be 3 films a day for total dose of 6 mg? Pt stated that he is totally out of the Suboxone . We will f/u on PA for the Abilify .

## 2024-06-17 ENCOUNTER — Ambulatory Visit: Payer: Self-pay | Admitting: Family Medicine

## 2024-06-17 ENCOUNTER — Ambulatory Visit (INDEPENDENT_AMBULATORY_CARE_PROVIDER_SITE_OTHER): Admitting: Family Medicine

## 2024-06-17 VITALS — BP 120/80 | HR 64 | Temp 98.0°F | Resp 16 | Ht 70.0 in | Wt 198.6 lb

## 2024-06-17 DIAGNOSIS — R202 Paresthesia of skin: Secondary | ICD-10-CM | POA: Diagnosis not present

## 2024-06-17 DIAGNOSIS — R634 Abnormal weight loss: Secondary | ICD-10-CM | POA: Diagnosis not present

## 2024-06-17 DIAGNOSIS — E162 Hypoglycemia, unspecified: Secondary | ICD-10-CM | POA: Diagnosis not present

## 2024-06-17 LAB — CBC
HCT: 42.4 % (ref 39.0–52.0)
Hemoglobin: 14.6 g/dL (ref 13.0–17.0)
MCHC: 34.3 g/dL (ref 30.0–36.0)
MCV: 84.7 fl (ref 78.0–100.0)
Platelets: 225 K/uL (ref 150.0–400.0)
RBC: 5.01 Mil/uL (ref 4.22–5.81)
RDW: 12.5 % (ref 11.5–15.5)
WBC: 4.7 K/uL (ref 4.0–10.5)

## 2024-06-17 LAB — COMPREHENSIVE METABOLIC PANEL WITH GFR
ALT: 26 U/L (ref 0–53)
AST: 20 U/L (ref 0–37)
Albumin: 4.9 g/dL (ref 3.5–5.2)
Alkaline Phosphatase: 43 U/L (ref 39–117)
BUN: 14 mg/dL (ref 6–23)
CO2: 29 meq/L (ref 19–32)
Calcium: 9.5 mg/dL (ref 8.4–10.5)
Chloride: 100 meq/L (ref 96–112)
Creatinine, Ser: 0.93 mg/dL (ref 0.40–1.50)
GFR: 103.63 mL/min
Glucose, Bld: 104 mg/dL — ABNORMAL HIGH (ref 70–99)
Potassium: 4.3 meq/L (ref 3.5–5.1)
Sodium: 137 meq/L (ref 135–145)
Total Bilirubin: 0.9 mg/dL (ref 0.2–1.2)
Total Protein: 7.1 g/dL (ref 6.0–8.3)

## 2024-06-17 LAB — VITAMIN B12: Vitamin B-12: 337 pg/mL (ref 211–911)

## 2024-06-17 LAB — TSH: TSH: 1.32 u[IU]/mL (ref 0.35–5.50)

## 2024-06-17 LAB — HEMOGLOBIN A1C: Hgb A1c MFr Bld: 5.7 % (ref 4.6–6.5)

## 2024-06-17 NOTE — Progress Notes (Signed)
 Chief Complaint  Patient presents with   Diabetes    Diabetes     Subjective: Patient is a 38 y.o. male here for sugar concerns.  Over the past few weeks, the patient has been having tingling on the inside of his left foot that happens intermittently, lightheadedness/weakness within 2 hours of eating.  He does have a family history of diabetes.  He has lost around 35 pounds since improving his diet and becoming more physically active.  When he eats he does feel better.  No bleeding, diarrhea, nausea/vomiting, coughing, shortness of breath.  Past Medical History:  Diagnosis Date   Anxiety    Concussion    multiple from college wrestling, no residual   DEPRESSION    History of kidney stones    Migraines    Nephrolithiasis    PAC (premature atrial contraction)    no cardiology no treatment needed last pac few yrs ago    Objective: BP 120/80 (BP Location: Left Arm, Patient Position: Sitting)   Pulse 64   Temp 98 F (36.7 C) (Oral)   Resp 16   Ht 5' 10 (1.778 m)   Wt 198 lb 9.6 oz (90.1 kg)   SpO2 96%   BMI 28.50 kg/m  General: Awake, appears stated age Heart: RRR, no LE edema Lungs: CTAB, no rales, wheezes or rhonchi. No accessory muscle use Psych: Age appropriate judgment and insight, normal affect and mood  Assessment and Plan: Weight loss - Plan: CBC, Hemoglobin A1c, TSH, Comprehensive metabolic panel with GFR  Paresthesias - Plan: B12  Hypoglycemia - Plan: Insulin and C-Peptide  Check above labs.  May have increased insulin resistance from before leading to hyperinsulinemia.  Doubt insulinoma.  Check above labs.  Could consider low-dose metformin for temporary insulin sensitivity aid while he improves his diet and add some strength training. The patient voiced understanding and agreement to the plan.  Mabel [place], DO 06/17/24  8:30 AM

## 2024-06-17 NOTE — Patient Instructions (Signed)
 Give us  2-3 business days to get the results of your labs back.   Keep the diet clean and stay active.  Let us  know if you need anything.

## 2024-06-18 ENCOUNTER — Telehealth (HOSPITAL_COMMUNITY): Payer: Self-pay | Admitting: *Deleted

## 2024-06-18 ENCOUNTER — Other Ambulatory Visit (HOSPITAL_COMMUNITY): Payer: Self-pay | Admitting: Psychiatry

## 2024-06-18 DIAGNOSIS — F39 Unspecified mood [affective] disorder: Secondary | ICD-10-CM

## 2024-06-18 DIAGNOSIS — F331 Major depressive disorder, recurrent, moderate: Secondary | ICD-10-CM

## 2024-06-18 LAB — INSULIN AND C-PEPTIDE, SERUM
C-Peptide: 2.7 ng/mL (ref 1.1–4.4)
INSULIN: 9.3 u[IU]/mL (ref 2.6–24.9)

## 2024-06-18 MED ORDER — CITALOPRAM HYDROBROMIDE 20 MG PO TABS: 30.0000 mg | ORAL_TABLET | Freq: Every day | ORAL | 0 refills | Status: DC

## 2024-06-18 NOTE — Telephone Encounter (Signed)
 Ok. Thanks. PA for Abilify  has been denied.

## 2024-06-18 NOTE — Telephone Encounter (Signed)
 Pt called stating that he is still has not been able to fill the Celexa  or the Suboxone . Please call his [facility] in [place] to clarify. I can't get anywhere with them. Thanks.

## 2024-06-22 NOTE — Progress Notes (Deleted)
 Psychiatric Adult Assessment Progress Note  Patient Identification: Jeremy Peterson MRN:  [id number] Date of Evaluation:  06/22/2024  Assessment: Patient presents in person for a follow-up appointment. In the prior visit, decreased Suboxone  to 6 mg. ***  Regarding PDMP, patient had difficulty picking up his Suboxone  prescription and the 4mg  Subsoxone that was filled on 10/1 was finished after 2 weeks as he maintained his original dose of 8 mg.   Plan:  # MDD vs bipolar 2 disorder - Continue Celexa  30 mg daily (pt taking nightly) - Abilify  ***  # GAD with panic attacks - Propranolol  10 mg TID PRN  # Previous opioid use (7-OH substance) - Decrease suboxone  to 6-1.5 mg***  - UDS negative on 06/10/2024  - PDMP checked, filling prescription appropriately- since picking up the rx on 10/16  Patient was given contact information for behavioral health clinic and was instructed to call 911 for emergencies.   Identifying Information: Jeremy Peterson is a 38 y.o. male with a history of depression and GAD who presents in person to [facility] for establishment of care. Patient started on suboxone  taper.   Subjective:  Patient seen ***.  Patient reports feeling *** today. Since the previous visit, ***. Stressors include ***.   Regarding psychiatric symptoms, ***. Patient reports the medications are ***. Patient reports the following adverse effects: ***.   Patient reports *** sleep, ***. Patient reports *** appetite, ***.   Patient denies current SI, HI, and AVH. ***  Past Psychiatric History:  Diagnoses: MDD, GAD Previous medications: Celexa  30 mg, gabapentin  (anxiety and neuropathy, states it was helpful and stopped because he prefers not be on multiple medication) Previous psychiatrist:  states in his teens  Previous therapist: Toya Ludwig- most recently in 02/2024, saw weekly for months.   Hospitalizations: denies Suicide attempts: 2 prior suicide attempts  both in his 20's via overdosing SIB: denies Current access to guns: denies  Hx of violence towards others: denies  Substance use:  Tobacco: yes, vaping, daily, 1 puff hourly Alcohol: denies Marijuana: stopped in May 2025, previously used daily Other illicit substances: denies  Previously used 7-OH, hydroxy 7 for 7 months which was obtained from the vape store. Stopped in August and was started on Suboxone .  Past Medical History:  Dx: denies  Medications: denies PCP: Dr. 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 Relationship: married for 7 years Children: 3 daughters and 1 son in the home (ages 3, 54, 78, 26) 1 daughter that lives with ex wife and one step son who lives out Support: mother and wife Legal History: denies  Past Medical History:  Past Medical History:  Diagnosis Date   Anxiety    Concussion    multiple from college wrestling, no residual   DEPRESSION    History of kidney stones    Migraines    Nephrolithiasis    PAC (premature atrial contraction)    no cardiology no treatment needed last pac few yrs ago    Past Surgical History:  Procedure Laterality Date   APPENDECTOMY  2011   CHOLECYSTECTOMY  2018   SURGERY SCROTAL / TESTICULAR  age 89   right   VASECTOMY Bilateral 03/18/2020   Procedure: VASECTOMY;  Surgeon: Nieves Cough, MD;  Location: [facility];  Service: Urology;  Laterality: Bilateral;    Family History:  Family History  Problem Relation Age of Onset   Heart disease Paternal Grandfather    Hyperlipidemia Father    Hypertension Father    Cancer Maternal Grandfather     Social History   Socioeconomic History   Marital status: Married    Spouse name: Not on file   Number of  children: 1   Years of education: Not on file   Highest education level: Associate degree: occupational, scientist, product/process development, or vocational program  Occupational History   Not on file  Tobacco Use   Smoking status: Former    Current packs/day: 0.00    Average packs/day: 1 pack/day for 5.0 years (5.0 ttl pk-yrs)    Types: Cigarettes    Start date: 09/04/2007    Quit date: 09/03/2012    Years since quitting: 11.8   Smokeless tobacco: Former  Building Services Engineer status: Former   Devices: yrs ago quit  Substance and Sexual Activity   Alcohol use: No   Drug use: Not Currently    Types: Benzodiazepines    Comment: CBD for anxiety   Sexual activity: Not on file  Other Topics Concern   Not on file  Social History Narrative   Not on file   Social Drivers of Health   Financial Resource Strain: Medium Risk (06/17/2024)   Overall Financial Resource Strain (CARDIA)    Difficulty of Paying Living Expenses: Somewhat hard  Food Insecurity: Food Insecurity Present (06/17/2024)   Hunger Vital Sign    Worried About Running Out of Food in the Last Year: Sometimes true    Ran Out of Food in the Last Year: Never true  Transportation Needs: No Transportation Needs (06/17/2024)   PRAPARE - Administrator, Civil Service (Medical): No    Lack of Transportation (Non-Medical): No  Physical Activity: Insufficiently Active (06/17/2024)   Exercise Vital Sign    Days of Exercise per Week: 3 days    Minutes of Exercise per Session: 30 min  Stress: Stress Concern Present (06/17/2024)   [employer] of Occupational Health - Occupational Stress Questionnaire    Feeling of Stress: To some extent  Social Connections: Moderately Integrated (06/17/2024)   Social Connection and Isolation Panel    Frequency of Communication with Friends and Family: More than three times a week    Frequency of Social Gatherings with Friends and Family: Once a week    Attends Religious Services: 1 to 4 times per year     Active Member of [employer] or Organizations: No    Attends Engineer, Structural: Not on file    Marital Status: Married  Recent Concern: Social Connections - Moderately Isolated (04/21/2024)   Social Connection and Isolation Panel    Frequency of Communication with Friends and Family: More than three times a week    Frequency of Social Gatherings with Friends and Family: Once a week    Attends Religious Services: 1 to 4 times per year    Active Member of [employer] or Organizations: No    Attends Engineer, Structural: Not on file    Marital Status: Divorced    Allergies:  Allergies  Allergen Reactions   Bactrim  [Sulfamethoxazole -Trimethoprim ] Nausea And Vomiting    Current Medications: Current Outpatient Medications  Medication Sig Dispense Refill   ARIPiprazole  (ABILIFY ) 5 MG tablet Take 0.5 tablets (2.5 mg total) by mouth daily for 7 days, THEN 1 tablet (5 mg total)  daily. 15 tablet 1   Buprenorphine  HCl-Naloxone  HCl (SUBOXONE ) 2-0.5 MG FILM Place 6 mg of opioid under the tongue daily at 2 PM. 40 each 0   citalopram  (CELEXA ) 20 MG tablet Take 1.5 tablets (30 mg total) by mouth daily. 90 tablet 0   propranolol  (INDERAL ) 10 MG tablet Take 1 tablet (10 mg total) by mouth 3 (three) times daily as needed. 180 tablet 0   rosuvastatin  (CRESTOR ) 20 MG tablet Take 1 tablet (20 mg total) by mouth daily. 90 tablet 3   No current facility-administered medications for this visit.    Objective:  Psychiatric Specialty Exam: General Appearance: appears at stated age, casually dressed and groomed ***  Behavior: pleasant and cooperative ***  Psychomotor Activity: no psychomotor agitation or retardation noted ***  Eye Contact: fair *** Speech: normal amount, volume and fluency ***   Mood: euthymic *** Affect: congruent, pleasant and interactive ***  Thought Process: linear, goal directed, no circumstantial or tangential thought process noted, no racing thoughts or flight of  ideas *** Descriptions of Associations: intact ***  Thought Content Hallucinations: denies AH, VH , does not appear responding to stimuli *** Delusions: no paranoia, delusions of control, grandeur, ideas of reference, thought broadcasting, and magical thinking *** Suicidal Thoughts: denies SI, intention, plan *** Homicidal Thoughts: denies HI, intention, plan ***  Alertness/Orientation: alert and fully oriented ***  Insight: fair*** Judgment: fair***  Memory: intact ***  Executive Functions  Concentration: intact *** Attention Span: fair *** Recall: intact *** Fund of Knowledge: fair ***  Physical Exam *** General: Pleasant, well-appearing ***. No acute distress. Pulmonary: Normal effort. No wheezing or rales. Skin: No obvious rash or lesions. Neuro: A&Ox3.No focal deficit.  Review of Systems *** No reported symptoms   Metabolic Disorder Labs: Lab Results  Component Value Date   HGBA1C 5.7 06/17/2024   No results found for: PROLACTIN Lab Results  Component Value Date   CHOL 142 01/21/2023   TRIG 88.0 01/21/2023   HDL 48.90 01/21/2023   CHOLHDL 3 01/21/2023   VLDL 17.6 01/21/2023   LDLCALC 75 01/21/2023   LDLCALC 169 (H) 12/04/2022   Lab Results  Component Value Date   TSH 1.32 06/17/2024    Therapeutic Level Labs: No results found for: LITHIUM No results found for: CBMZ No results found for: VALPROATE  Screenings:  GAD-7    Flowsheet Row Office Visit from 06/17/2024 in [facility] at [facility] Office Visit from 10/22/2023 in [facility] Care at [facility]  Total GAD-7 Score 0 3   PHQ2-9    Flowsheet Row Office Visit from 06/17/2024 in [facility] at [facility] Office Visit from 10/22/2023 in [facility] Primary Care at [facility] Office Visit from 10/19/2022 in [facility] at [facility] Office Visit from  03/14/2022 in [name] Primary Care at [facility]  PHQ-2 Total Score 0 2 0 3  PHQ-9 Total Score 0 2 2 10    Flowsheet Row ED from 02/04/2024 in [facility] ED from 11/22/2023 in [facility] Emergency Department at [facility] ED from 11/20/2023 in [facility] Emergency Department at [facility]  C-SSRS RISK CATEGORY Moderate Risk No Risk No Risk    Collaboration of Care: Case discussed with attending, see attending's attestation for additional information.  Ismael Franco, MD PGY-3 Psychiatry Resident

## 2024-06-23 ENCOUNTER — Telehealth (HOSPITAL_COMMUNITY): Payer: Self-pay

## 2024-06-23 NOTE — Telephone Encounter (Signed)
 Patients Abilify  was approved for 1 year. 06/22/2024 - 06/22/2025. PA- [id number]

## 2024-07-01 ENCOUNTER — Ambulatory Visit (HOSPITAL_COMMUNITY): Admitting: Psychiatry

## 2024-07-01 NOTE — Progress Notes (Signed)
 Psychiatric Adult Assessment Progress Note  Patient Identification: Jeremy Peterson MRN:  [id number] Date of Evaluation:  07/01/2024  Assessment: Patient presents in person for a follow-up appointment. In the prior visit, decreased Suboxone  to 6 mg.   Today, patient presents with ongoing struggles with taking his intended dose of 6 mg of Suboxone , stating most of the time, he is taking 8 mg instead of 6 mg, attributing the difficulty due to not feeling well when he does decrease the dose. We discussed that we can increase the Suboxone  dose back to 8 mg as he may not be tolerating his taper at this time. Patient's prior authorization for Abilify  was approved and he picked up the medication 3 days ago.  Discussed that he can start Abilify  at 2.5 mg and if tolerating increase to 5 mg after a week with the hope that this can help with mood stabilization.  Patient can also benefit from CDIOP regarding his substance use and he does show insight and motivation regarding his motivating factors on staying off hydroxy 7.  Placed referral for CDIOP.  Per PDMP, it seems that patient is likely taking 8 mg of Suboxone  almost daily prior to today's visit.  Follow-up in 1 month.  Regarding PDMP, patient had difficulty picking up his Suboxone  prescription and the 4mg  Subsoxone that was filled on 10/1 was finished after 2 weeks as he maintained his original dose of 8 mg.   Plan:  # MDD vs bipolar 2 disorder - Continue Celexa  30 mg daily (pt taking nightly) - Start Abilify  2.5 mg daily and after a week increase to 5 mg  - Labs updated 10/25 except lipid panel- plan to obtain in next visit - Giving CDIOP resources   # GAD with panic attacks - Propranolol  10 mg TID PRN  # Previous opioid use (7-OH substance) - Increase suboxone  to 8-2 mg  - UDS negative on 06/10/2024  - PDMP checked, filling prescription appropriately- filled the rx on 10/16  Patient was given contact information for behavioral health clinic  and was instructed to call 911 for emergencies.   Identifying Information: Jeremy STUEVE is a 38 y.o. male with a history of depression and GAD who presents in person to [facility] for establishment of care. Patient started on suboxone  taper.   Subjective:  Patient seen alone.  Patient reports feeling good today. Since the previous visit, he states he has been trying to take 8 mg more than 6 mg. He states trying to drop to 4 mg of suboxone  for one day but he noticed it was rough. He states when he has more idle time, he notices a lack of energy, happiness, and excitement and attributes this to being off the hydroxy 7. He notes feeling less relaxed. He says taking propranolol  about twice a day and he feels it has been helpful and states he has not had a panic attack since starting. He feels that 8 mg of suboxone  is where he needs to be at this time. He states that he will run out of suboxone  tomorrow. He does have cravings of hydroxy 7. He states his motivating factor is his wife and children. He notes having mood going up and down.  Patient reports good sleep. Patient reports good appetite.   Patient denies current SI, HI, and AVH.  Past Psychiatric History:  Diagnoses: MDD, GAD Previous medications: Celexa  30 mg, gabapentin  (anxiety and neuropathy, states it was helpful and stopped because he prefers not be  on multiple medication) Previous psychiatrist:  states in his teens  Previous therapist: Toya Ludwig- stopped seeing months ago  Hospitalizations: denies Suicide attempts: 2 prior suicide attempts both in his 20's via overdosing SIB: denies Current access to guns: denies  Hx of violence towards others: denies  Substance use:  Tobacco: yes, vaping, daily, 1 puff hourly Alcohol: denies Marijuana: stopped in May 2025, previously used daily Other illicit substances: denies  Previously used 7-OH, hydroxy 7 for 7 months which was obtained from the vape  store. Stopped in August and was started on Suboxone .  Past Medical History:  Dx: denies  Medications: denies PCP: Dr. 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 Relationship: married for 7 years Children: 3 daughters and 1 son in the home (ages 44, 2, 26, 39) 1 daughter that lives with ex wife and one step son who lives out Support: mother and wife Legal History: denies  Past Medical History:  Past Medical History:  Diagnosis Date   Anxiety    Concussion    multiple from college wrestling, no residual   DEPRESSION    History of kidney stones    Migraines    Nephrolithiasis    PAC (premature atrial contraction)    no cardiology no treatment needed last pac few yrs ago    Past Surgical History:  Procedure Laterality Date   APPENDECTOMY  2011   CHOLECYSTECTOMY  2018   SURGERY SCROTAL / TESTICULAR  age 35   right   VASECTOMY Bilateral 03/18/2020   Procedure: VASECTOMY;  Surgeon: Nieves Cough, MD;  Location: [facility];  Service: Urology;  Laterality: Bilateral;    Family History:  Family History  Problem Relation Age of Onset   Heart disease Paternal Grandfather    Hyperlipidemia Father    Hypertension Father    Cancer Maternal Grandfather     Social History   Socioeconomic History   Marital status: Married    Spouse name: Not on file   Number of children: 1   Years of education: Not on file   Highest education level: Associate degree: occupational, scientist, product/process development, or vocational program  Occupational History   Not on file  Tobacco Use   Smoking status: Former    Current packs/day: 0.00    Average packs/day: 1 pack/day for 5.0 years (5.0 ttl pk-yrs)    Types: Cigarettes    Start date: 09/04/2007    Quit date:  09/03/2012    Years since quitting: 11.8   Smokeless tobacco: Former  Building Services Engineer status: Former   Devices: yrs ago quit  Substance and Sexual Activity   Alcohol use: No   Drug use: Not Currently    Types: Benzodiazepines    Comment: CBD for anxiety   Sexual activity: Not on file  Other Topics Concern   Not on file  Social History Narrative   Not on file   Social Drivers of Health   Financial Resource Strain: Medium Risk (06/17/2024)   Overall Financial Resource Strain (CARDIA)    Difficulty of Paying Living Expenses: Somewhat hard  Food Insecurity: Food Insecurity Present (06/17/2024)   Hunger Vital Sign    Worried About Running Out of Food in the Last Year: Sometimes true    Ran Out of Food in the  Last Year: Never true  Transportation Needs: No Transportation Needs (06/17/2024)   PRAPARE - Administrator, Civil Service (Medical): No    Lack of Transportation (Non-Medical): No  Physical Activity: Insufficiently Active (06/17/2024)   Exercise Vital Sign    Days of Exercise per Week: 3 days    Minutes of Exercise per Session: 30 min  Stress: Stress Concern Present (06/17/2024)   [employer] of Occupational Health - Occupational Stress Questionnaire    Feeling of Stress: To some extent  Social Connections: Moderately Integrated (06/17/2024)   Social Connection and Isolation Panel    Frequency of Communication with Friends and Family: More than three times a week    Frequency of Social Gatherings with Friends and Family: Once a week    Attends Religious Services: 1 to 4 times per year    Active Member of [employer] or Organizations: No    Attends Engineer, Structural: Not on file    Marital Status: Married  Recent Concern: Social Connections - Moderately Isolated (04/21/2024)   Social Connection and Isolation Panel    Frequency of Communication with Friends and Family: More than three times a week    Frequency of Social Gatherings with Friends  and Family: Once a week    Attends Religious Services: 1 to 4 times per year    Active Member of [employer] or Organizations: No    Attends Engineer, Structural: Not on file    Marital Status: Divorced    Allergies:  Allergies  Allergen Reactions   Bactrim  [Sulfamethoxazole -Trimethoprim ] Nausea And Vomiting    Current Medications: Current Outpatient Medications  Medication Sig Dispense Refill   ARIPiprazole  (ABILIFY ) 5 MG tablet Take 0.5 tablets (2.5 mg total) by mouth daily for 7 days, THEN 1 tablet (5 mg total) daily. 15 tablet 1   Buprenorphine  HCl-Naloxone  HCl (SUBOXONE ) 2-0.5 MG FILM Place 6 mg of opioid under the tongue daily at 2 PM. 40 each 0   citalopram  (CELEXA ) 20 MG tablet Take 1.5 tablets (30 mg total) by mouth daily. 90 tablet 0   propranolol  (INDERAL ) 10 MG tablet Take 1 tablet (10 mg total) by mouth 3 (three) times daily as needed. 180 tablet 0   rosuvastatin  (CRESTOR ) 20 MG tablet Take 1 tablet (20 mg total) by mouth daily. 90 tablet 3   No current facility-administered medications for this visit.    Objective:  Psychiatric Specialty Exam: General Appearance: appears at stated age, casually dressed and groomed   Behavior: pleasant and cooperative   Psychomotor Activity: no psychomotor agitation or retardation noted   Eye Contact: fair  Speech: normal amount, volume and fluency    Mood: euthymic  Affect: congruent, pleasant and interactive   Thought Process: linear, goal directed, no circumstantial or tangential thought process noted, no racing thoughts or flight of ideas  Descriptions of Associations: intact   Thought Content Hallucinations: denies AH, VH , does not appear responding to stimuli  Delusions: no paranoia, delusions of control, grandeur, ideas of reference, thought broadcasting, and magical thinking  Suicidal Thoughts: denies SI, intention, plan  Homicidal Thoughts: denies HI, intention, plan   Alertness/Orientation: alert and fully  oriented   Insight: fair Judgment: fair  Memory: intact   Executive Functions  Concentration: intact  Attention Span: fair  Recall: intact  Fund of Knowledge: fair   Physical Exam  General: Pleasant, well-appearing. No acute distress. Pulmonary: Normal effort. No wheezing or rales. Skin: No obvious rash or lesions. Neuro: A&Ox3.No  focal deficit.  Review of Systems  No reported symptoms   Metabolic Disorder Labs: Lab Results  Component Value Date   HGBA1C 5.7 06/17/2024   No results found for: PROLACTIN Lab Results  Component Value Date   CHOL 142 01/21/2023   TRIG 88.0 01/21/2023   HDL 48.90 01/21/2023   CHOLHDL 3 01/21/2023   VLDL 17.6 01/21/2023   LDLCALC 75 01/21/2023   LDLCALC 169 (H) 12/04/2022   Lab Results  Component Value Date   TSH 1.32 06/17/2024    Therapeutic Level Labs: No results found for: LITHIUM No results found for: CBMZ No results found for: VALPROATE  Screenings:  GAD-7    Flowsheet Row Office Visit from 06/17/2024 in [facility] at [facility] Office Visit from 10/22/2023 in [facility] Primary Care at [facility]  Total GAD-7 Score 0 3   PHQ2-9    Flowsheet Row Office Visit from 06/17/2024 in [facility] Primary Care at [facility] Office Visit from 10/22/2023 in [facility] Primary Care at [facility] Office Visit from 10/19/2022 in [facility] at [facility] Office Visit from 03/14/2022 in [facility] Primary Care at [facility]  PHQ-2 Total Score 0 2 0 3  PHQ-9 Total Score 0 2 2 10    Flowsheet Row ED from 02/04/2024 in [facility] ED from 11/22/2023 in [facility] at [facility] ED from 11/20/2023 in [facility] Emergency Department at [facility]  C-SSRS RISK CATEGORY Moderate Risk No Risk No Risk    Collaboration of Care: Case  discussed with attending, see attending's attestation for additional information.  Ismael Franco, MD PGY-3 Psychiatry Resident

## 2024-07-08 ENCOUNTER — Telehealth (HOSPITAL_COMMUNITY): Payer: Self-pay | Admitting: Licensed Clinical Social Worker

## 2024-07-08 ENCOUNTER — Ambulatory Visit (HOSPITAL_COMMUNITY): Admitting: Psychiatry

## 2024-07-08 VITALS — BP 123/71 | Wt 198.4 lb

## 2024-07-08 DIAGNOSIS — F39 Unspecified mood [affective] disorder: Secondary | ICD-10-CM

## 2024-07-08 DIAGNOSIS — F331 Major depressive disorder, recurrent, moderate: Secondary | ICD-10-CM

## 2024-07-08 DIAGNOSIS — F411 Generalized anxiety disorder: Secondary | ICD-10-CM | POA: Diagnosis not present

## 2024-07-08 DIAGNOSIS — F1110 Opioid abuse, uncomplicated: Secondary | ICD-10-CM | POA: Diagnosis not present

## 2024-07-08 MED ORDER — CITALOPRAM HYDROBROMIDE 20 MG PO TABS: 30.0000 mg | ORAL_TABLET | Freq: Every day | ORAL | 0 refills | Status: AC

## 2024-07-08 MED ORDER — SUBOXONE 8-2 MG SL FILM: 8.0000 mg | ORAL_FILM | Freq: Every day | SUBLINGUAL | 0 refills | Status: DC

## 2024-07-08 NOTE — Telephone Encounter (Signed)
 The therapist attempts to reach [facility] at the request of his psychiatrist, Dr Izella, leaving a HIPAA-compliant voicemail with his direct contact number.  Zell Maier, MA, LCSW, [facility], LCAS 07/08/2024

## 2024-07-17 ENCOUNTER — Other Ambulatory Visit (HOSPITAL_COMMUNITY): Payer: Self-pay | Admitting: Psychiatry

## 2024-07-17 DIAGNOSIS — F1110 Opioid abuse, uncomplicated: Secondary | ICD-10-CM

## 2024-07-17 MED ORDER — BUPRENORPHINE HCL-NALOXONE HCL 8-2 MG SL FILM: 8.0000 mg | ORAL_FILM | Freq: Every day | SUBLINGUAL | 0 refills | Status: AC

## 2024-08-03 NOTE — Progress Notes (Deleted)
 Psychiatric Adult Assessment Progress Note  Patient Identification: Jeremy Peterson MRN:  [id number] Date of Evaluation:  08/03/2024  Assessment: Patient presents for a follow-up appointment. In the prior appointment,  the following changes were made: increased suboxone  to 8-2 mg and started Abilify . Today, ***   Plan:  # MDD vs bipolar 2 disorder - Continue Celexa  30 mg daily (pt taking nightly) - Start Abilify  2.5 mg daily and after a week increase to 5 mg  - Labs updated 10/25 except lipid panel- plan to obtain in next visit - Giving CDIOP resources   # GAD with panic attacks - Continue Propranolol  10 mg TID PRN  # Previous opioid use (7-OH substance) - Increase suboxone  to 8-2 mg  - UDS negative on 06/10/2024  - PDMP checked, filling prescription appropriately- filled the rx on 10/16  Patient was given contact information for behavioral health clinic and was instructed to call 911 for emergencies.   Identifying Information: Jeremy Peterson is a 38 y.o. male with a history of depression and GAD who presents in person to [facility] for establishment of care. Patient started on suboxone  taper.   Subjective:  Patient seen ***.  Patient reports feeling *** today. Since the previous visit, ***. Stressors include ***.   Regarding psychiatric symptoms, ***. Patient reports the medications are ***. Patient reports the following adverse effects: ***.   Patient reports *** sleep, ***. Patient reports *** appetite, ***.   Patient denies current SI, HI, and AVH. ***  Substance use:  Tobacco: *** Alcohol: *** Illicit substances: ***   Past Psychiatric History:  Diagnoses: MDD, GAD Previous medications: Celexa  30 mg, gabapentin  (anxiety and neuropathy, states it was helpful and stopped because he prefers not be on multiple medication) Previous psychiatrist:  states in his teens  Previous therapist: Toya Peterson- stopped seeing months  ago  Hospitalizations: denies Suicide attempts: 2 prior suicide attempts both in his 20's via overdosing SIB: denies Current access to guns: denies  Hx of violence towards others: denies  Substance use:  Tobacco: yes, vaping, daily, 1 puff hourly Alcohol: denies Marijuana: stopped in May 2025, previously used daily Other illicit substances: denies  Previously used 7-OH, hydroxy 7 for 7 months which was obtained from the vape store. Stopped in August and was started on Suboxone .  Past Medical History:  Dx: denies  Medications: denies PCP: Dr. 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 Relationship: married for 7 years Children: 3 daughters and 1 son in the home (ages 29, 85, 66, 16) 1 daughter that lives with ex wife and one step son who lives out Support: mother and wife Legal History: denies  Past Medical History:  Past Medical History:  Diagnosis Date   Anxiety    Concussion    multiple from college wrestling, no residual   DEPRESSION    History of kidney stones    Migraines    Nephrolithiasis    PAC (premature atrial contraction)    no cardiology no treatment needed last pac few yrs ago    Past Surgical History:  Procedure Laterality Date   APPENDECTOMY  2011   CHOLECYSTECTOMY  2018   SURGERY SCROTAL / TESTICULAR  age 55   right   VASECTOMY Bilateral 03/18/2020   Procedure: VASECTOMY;  Surgeon: Nieves Cough, MD;  Location: [facility];  Service: Urology;  Laterality: Bilateral;    Family History:  Family History  Problem Relation Age of Onset   Heart disease Paternal Grandfather    Hyperlipidemia Father    Hypertension Father    Cancer Maternal Grandfather     Social History   Socioeconomic History    Marital status: Married    Spouse name: Not on file   Number of children: 1   Years of education: Not on file   Highest education level: Associate degree: occupational, scientist, product/process development, or vocational program  Occupational History   Not on file  Tobacco Use   Smoking status: Former    Current packs/day: 0.00    Average packs/day: 1 pack/day for 5.0 years (5.0 ttl pk-yrs)    Types: Cigarettes    Start date: 09/04/2007    Quit date: 09/03/2012    Years since quitting: 11.9   Smokeless tobacco: Former  Building Services Engineer status: Former   Devices: yrs ago quit  Substance and Sexual Activity   Alcohol use: No   Drug use: Not Currently    Types: Benzodiazepines    Comment: CBD for anxiety   Sexual activity: Not on file  Other Topics Concern   Not on file  Social History Narrative   Not on file   Social Drivers of Health   Financial Resource Strain: Medium Risk (06/17/2024)   Overall Financial Resource Strain (CARDIA)    Difficulty of Paying Living Expenses: Somewhat hard  Food Insecurity: Food Insecurity Present (06/17/2024)   Hunger Vital Sign    Worried About Running Out of Food in the Last Year: Sometimes true    Ran Out of Food in the Last Year: Never true  Transportation Needs: No Transportation Needs (06/17/2024)   PRAPARE - Administrator, Civil Service (Medical): No    Lack of Transportation (Non-Medical): No  Physical Activity: Insufficiently Active (06/17/2024)   Exercise Vital Sign    Days of Exercise per Week: 3 days    Minutes of Exercise per Session: 30 min  Stress: Stress Concern Present (06/17/2024)   [employer] of Occupational Health - Occupational Stress Questionnaire    Feeling of Stress: To some extent  Social Connections: Moderately Integrated (06/17/2024)   Social Connection and Isolation Panel    Frequency of Communication with Friends and Family: More than three times a week    Frequency of Social Gatherings with Friends and Family:  Once a week    Attends Religious Services: 1 to 4 times per year    Active Member of [employer] or Organizations: No    Attends Engineer, Structural: Not on file    Marital Status: Married  Recent Concern: Social Connections - Moderately Isolated (04/21/2024)   Social Connection and Isolation Panel    Frequency of Communication with Friends and Family: More than three times a week    Frequency of Social Gatherings with Friends and Family: Once a week    Attends Religious Services: 1 to 4 times per year    Active Member of [employer] or Organizations: No    Attends Engineer, Structural: Not on file    Marital Status: Divorced    Allergies:  Allergies  Allergen Reactions   Bactrim  [Sulfamethoxazole -Trimethoprim ] Nausea And Vomiting    Current Medications: Current Outpatient Medications  Medication Sig Dispense Refill   ARIPiprazole  (ABILIFY ) 5 MG tablet Take 0.5 tablets (2.5 mg total) by  mouth daily for 7 days, THEN 1 tablet (5 mg total) daily. 15 tablet 1   Buprenorphine  HCl-Naloxone  HCl (SUBOXONE ) 8-2 MG FILM Place 1 Film under the tongue daily. 30 each 0   citalopram  (CELEXA ) 20 MG tablet Take 1.5 tablets (30 mg total) by mouth daily. 90 tablet 0   propranolol  (INDERAL ) 10 MG tablet Take 1 tablet (10 mg total) by mouth 3 (three) times daily as needed. 180 tablet 0   rosuvastatin  (CRESTOR ) 20 MG tablet Take 1 tablet (20 mg total) by mouth daily. 90 tablet 3   No current facility-administered medications for this visit.    Objective: Psychiatric Specialty Exam: General Appearance: appears at stated age, casually dressed and groomed ***  Behavior: pleasant and cooperative ***  Psychomotor Activity: no psychomotor agitation or retardation noted ***  Eye Contact: fair *** Speech: normal amount, volume and fluency ***   Mood: euthymic *** Affect: congruent, pleasant and interactive ***  Thought Process: linear, goal directed, no circumstantial or tangential thought  process noted, no racing thoughts or flight of ideas *** Descriptions of Associations: intact ***  Thought Content Hallucinations: denies AH, VH , does not appear responding to stimuli *** Delusions: no paranoia, delusions of control, grandeur, ideas of reference, thought broadcasting, and magical thinking *** Suicidal Thoughts: denies SI, intention, plan *** Homicidal Thoughts: denies HI, intention, plan ***  Alertness/Orientation: alert and fully oriented ***  Insight: fair*** Judgment: fair***  Memory: intact ***  Executive Functions  Concentration: intact *** Attention Span: fair *** Recall: intact *** Fund of Knowledge: fair ***  Physical Exam *** General: Pleasant, well-appearing. No acute distress. Pulmonary: Normal effort. No wheezing or rales. Skin: No obvious rash or lesions.*** Neuro: A&Ox3.No focal deficit.  Review of Systems *** No reported symptoms   Metabolic Disorder Labs: Lab Results  Component Value Date   HGBA1C 5.7 06/17/2024   No results found for: PROLACTIN Lab Results  Component Value Date   CHOL 142 01/21/2023   TRIG 88.0 01/21/2023   HDL 48.90 01/21/2023   CHOLHDL 3 01/21/2023   VLDL 17.6 01/21/2023   LDLCALC 75 01/21/2023   LDLCALC 169 (H) 12/04/2022   Lab Results  Component Value Date   TSH 1.32 06/17/2024    Therapeutic Level Labs: No results found for: LITHIUM No results found for: CBMZ No results found for: VALPROATE  Screenings:  GAD-7    Flowsheet Row Office Visit from 06/17/2024 in [facility] Primary Care at [facility] Office Visit from 10/22/2023 in [facility] Primary Care at [facility]  Total GAD-7 Score 0 3   PHQ2-9    Flowsheet Row Office Visit from 06/17/2024 in [facility] Care at [facility] Office Visit from 10/22/2023 in [facility] Primary Care at [facility] Office Visit from 10/19/2022 in [facility] Primary Care at  [facility] Office Visit from 03/14/2022 in [facility] Primary Care at [facility]  PHQ-2 Total Score 0 2 0 3  PHQ-9 Total Score 0 2 2 10    Flowsheet Row ED from 02/04/2024 in [facility] ED from 11/22/2023 in [facility] Emergency Department at [facility] ED from 11/20/2023 in [facility] Emergency Department at [facility]  C-SSRS RISK CATEGORY Moderate Risk No Risk No Risk    Collaboration of Care: Case discussed with attending, see attending's attestation for additional information.  Ismael Franco, MD PGY-3 Psychiatry Resident

## 2024-08-10 ENCOUNTER — Ambulatory Visit (HOSPITAL_COMMUNITY): Admitting: Psychiatry

## 2024-08-10 NOTE — Progress Notes (Deleted)
 Psychiatric Adult Assessment Progress Note  Patient Identification: Jeremy Peterson MRN:  [id number] Date of Evaluation:  08/10/2024  Assessment: Patient presents for a follow-up appointment. In the prior appointment,  the following changes were made: increased suboxone  to 8-2 mg and started Abilify . Today, ***   Plan:  # MDD vs bipolar 2 disorder - Continue Celexa  30 mg daily (pt taking nightly) - Start Abilify  2.5 mg daily and after a week increase to 5 mg  - Labs updated 10/25 except lipid panel- plan to obtain in next visit - Giving CDIOP resources   # GAD with panic attacks - Continue Propranolol  10 mg TID PRN  # Previous opioid use (7-OH substance) - Increase suboxone  to 8-2 mg  - UDS negative on 06/10/2024  - PDMP checked, filling prescription appropriately- filled the rx on 11/14  Patient was given contact information for behavioral health clinic and was instructed to call 911 for emergencies.   Identifying Information: Jeremy Peterson is a 38 y.o. male with a history of depression and GAD who presents in person to [facility] for establishment of care. Patient started on suboxone .   Subjective:  Patient seen ***.  Patient reports feeling *** today. Since the previous visit, ***. Stressors include ***.   Regarding psychiatric symptoms, ***. Patient reports the medications are ***. Patient reports the following adverse effects: ***.   Patient reports *** sleep, ***. Patient reports *** appetite, ***.   Patient denies current SI, HI, and AVH. ***  Substance use:  Tobacco: *** Alcohol: *** Illicit substances: ***   Past Psychiatric History:  Diagnoses: MDD, GAD Previous medications: Celexa  30 mg, gabapentin  (anxiety and neuropathy, states it was helpful and stopped because he prefers not be on multiple medication) Previous psychiatrist:  states in his teens  Previous therapist: Toya Ludwig- stopped seeing months ago  Hospitalizations:  denies Suicide attempts: 2 prior suicide attempts both in his 20's via overdosing SIB: denies Current access to guns: denies  Hx of violence towards others: denies  Substance use:  Tobacco: yes, vaping, daily, 1 puff hourly Alcohol: denies Marijuana: stopped in May 2025, previously used daily Other illicit substances: denies  Previously used 7-OH, hydroxy 7 for 7 months which was obtained from the vape store. Stopped in August and was started on Suboxone .  Past Medical History:  Dx: denies  Medications: denies PCP: Dr. 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 Relationship: married for 7 years Children: 3 daughters and 1 son in the home (ages 37, 27, 10, 31) 1 daughter that lives with ex wife and one step son who lives out Support: mother and wife Legal History: denies  Past Medical History:  Past Medical History:  Diagnosis Date   Anxiety    Concussion    multiple from college wrestling, no residual   DEPRESSION    History of kidney stones    Migraines    Nephrolithiasis    PAC (premature atrial contraction)    no cardiology no treatment needed last pac few yrs ago    Past Surgical History:  Procedure Laterality Date   APPENDECTOMY  2011   CHOLECYSTECTOMY  2018   SURGERY SCROTAL / TESTICULAR  age 38   right   VASECTOMY Bilateral 03/18/2020   Procedure: VASECTOMY;  Surgeon:  Nieves Cough, MD;  Location: [facility];  Service: Urology;  Laterality: Bilateral;    Family History:  Family History  Problem Relation Age of Onset   Heart disease Paternal Grandfather    Hyperlipidemia Father    Hypertension Father    Cancer Maternal Grandfather     Social History   Socioeconomic History   Marital status:  Married    Spouse name: Not on file   Number of children: 1   Years of education: Not on file   Highest education level: Associate degree: occupational, scientist, product/process development, or vocational program  Occupational History   Not on file  Tobacco Use   Smoking status: Former    Current packs/day: 0.00    Average packs/day: 1 pack/day for 5.0 years (5.0 ttl pk-yrs)    Types: Cigarettes    Start date: 09/04/2007    Quit date: 09/03/2012    Years since quitting: 11.9   Smokeless tobacco: Former  Building Services Engineer status: Former   Devices: yrs ago quit  Substance and Sexual Activity   Alcohol use: No   Drug use: Not Currently    Types: Benzodiazepines    Comment: CBD for anxiety   Sexual activity: Not on file  Other Topics Concern   Not on file  Social History Narrative   Not on file   Social Drivers of Health   Financial Resource Strain: Medium Risk (06/17/2024)   Overall Financial Resource Strain (CARDIA)    Difficulty of Paying Living Expenses: Somewhat hard  Food Insecurity: Food Insecurity Present (06/17/2024)   Hunger Vital Sign    Worried About Running Out of Food in the Last Year: Sometimes true    Ran Out of Food in the Last Year: Never true  Transportation Needs: No Transportation Needs (06/17/2024)   PRAPARE - Administrator, Civil Service (Medical): No    Lack of Transportation (Non-Medical): No  Physical Activity: Insufficiently Active (06/17/2024)   Exercise Vital Sign    Days of Exercise per Week: 3 days    Minutes of Exercise per Session: 30 min  Stress: Stress Concern Present (06/17/2024)   [employer] of Occupational Health - Occupational Stress Questionnaire    Feeling of Stress: To some extent  Social Connections: Moderately Integrated (06/17/2024)   Social Connection and Isolation Panel    Frequency of Communication with Friends and Family: More than three times a week    Frequency of Social Gatherings with Friends and Family: Once a week     Attends Religious Services: 1 to 4 times per year    Active Member of [employer] or Organizations: No    Attends Engineer, Structural: Not on file    Marital Status: Married  Recent Concern: Social Connections - Moderately Isolated (04/21/2024)   Social Connection and Isolation Panel    Frequency of Communication with Friends and Family: More than three times a week    Frequency of Social Gatherings with Friends and Family: Once a week    Attends Religious Services: 1 to 4 times per year    Active Member of [employer] or Organizations: No    Attends Engineer, Structural: Not on file    Marital Status: Divorced    Allergies:  Allergies  Allergen Reactions   Bactrim  [Sulfamethoxazole -Trimethoprim ] Nausea And Vomiting    Current Medications: Current Outpatient Medications  Medication Sig Dispense Refill   ARIPiprazole  (ABILIFY ) 5 MG tablet Take 0.5 tablets (2.5 mg total) by mouth  daily for 7 days, THEN 1 tablet (5 mg total) daily. 15 tablet 1   Buprenorphine  HCl-Naloxone  HCl (SUBOXONE ) 8-2 MG FILM Place 1 Film under the tongue daily. 30 each 0   citalopram  (CELEXA ) 20 MG tablet Take 1.5 tablets (30 mg total) by mouth daily. 90 tablet 0   propranolol  (INDERAL ) 10 MG tablet Take 1 tablet (10 mg total) by mouth 3 (three) times daily as needed. 180 tablet 0   rosuvastatin  (CRESTOR ) 20 MG tablet Take 1 tablet (20 mg total) by mouth daily. 90 tablet 3   No current facility-administered medications for this visit.    Objective: Psychiatric Specialty Exam: General Appearance: appears at stated age, casually dressed and groomed ***  Behavior: pleasant and cooperative ***  Psychomotor Activity: no psychomotor agitation or retardation noted ***  Eye Contact: fair *** Speech: normal amount, volume and fluency ***   Mood: euthymic *** Affect: congruent, pleasant and interactive ***  Thought Process: linear, goal directed, no circumstantial or tangential thought process noted, no  racing thoughts or flight of ideas *** Descriptions of Associations: intact ***  Thought Content Hallucinations: denies AH, VH , does not appear responding to stimuli *** Delusions: no paranoia, delusions of control, grandeur, ideas of reference, thought broadcasting, and magical thinking *** Suicidal Thoughts: denies SI, intention, plan *** Homicidal Thoughts: denies HI, intention, plan ***  Alertness/Orientation: alert and fully oriented ***  Insight: fair*** Judgment: fair***  Memory: intact ***  Executive Functions  Concentration: intact *** Attention Span: fair *** Recall: intact *** Fund of Knowledge: fair ***  Physical Exam *** General: Pleasant, well-appearing. No acute distress. Pulmonary: Normal effort. No wheezing or rales. Skin: No obvious rash or lesions.*** Neuro: A&Ox3.No focal deficit.  Review of Systems *** No reported symptoms   Metabolic Disorder Labs: Lab Results  Component Value Date   HGBA1C 5.7 06/17/2024   No results found for: PROLACTIN Lab Results  Component Value Date   CHOL 142 01/21/2023   TRIG 88.0 01/21/2023   HDL 48.90 01/21/2023   CHOLHDL 3 01/21/2023   VLDL 17.6 01/21/2023   LDLCALC 75 01/21/2023   LDLCALC 169 (H) 12/04/2022   Lab Results  Component Value Date   TSH 1.32 06/17/2024    Therapeutic Level Labs: No results found for: LITHIUM No results found for: CBMZ No results found for: VALPROATE  Screenings:  GAD-7    Flowsheet Row Office Visit from 06/17/2024 in [facility] at [facility] Office Visit from 10/22/2023 in [facility] at [facility]  Total GAD-7 Score 0 3   PHQ2-9    Flowsheet Row Office Visit from 06/17/2024 in [facility] at [facility] Office Visit from 10/22/2023 in [facility] at [facility] Office Visit from 10/19/2022 in [facility] at [facility] Office Visit from 03/14/2022 in [facility] at [facility]  PHQ-2 Total Score 0 2 0 3  PHQ-9 Total Score 0 2 2 10    Flowsheet Row ED from 02/04/2024 in [facility] ED from 11/22/2023 in [facility] Emergency Department at [facility] ED from 11/20/2023 in [facility] Emergency Department at [facility]  C-SSRS RISK CATEGORY Moderate Risk No Risk No Risk    Collaboration of Care: Case discussed with attending, see attending's attestation for additional information.  Ismael Franco, MD PGY-3 Psychiatry Resident

## 2024-08-17 ENCOUNTER — Telehealth (HOSPITAL_COMMUNITY): Payer: Self-pay | Admitting: Psychiatry

## 2024-08-17 ENCOUNTER — Ambulatory Visit (HOSPITAL_COMMUNITY): Admitting: Psychiatry

## 2024-08-17 NOTE — Telephone Encounter (Signed)
 Patient did not show up for the appointment.  Attempted to call the patient at 2:05 PM but received no response. Left a HIPAA compliant voicemail for patient to reschedule.   Ismael Franco, MD PGY-3 Psychiatry Resident

## 2024-08-18 ENCOUNTER — Other Ambulatory Visit (HOSPITAL_COMMUNITY): Payer: Self-pay | Admitting: Psychiatry

## 2024-08-18 DIAGNOSIS — F1110 Opioid abuse, uncomplicated: Secondary | ICD-10-CM

## 2024-08-31 ENCOUNTER — Other Ambulatory Visit (HOSPITAL_COMMUNITY): Payer: Self-pay | Admitting: Student

## 2024-08-31 ENCOUNTER — Other Ambulatory Visit (HOSPITAL_COMMUNITY): Payer: Self-pay | Admitting: Psychiatry

## 2024-08-31 DIAGNOSIS — F411 Generalized anxiety disorder: Secondary | ICD-10-CM

## 2024-08-31 MED ORDER — PROPRANOLOL HCL 10 MG PO TABS: 10.0000 mg | ORAL_TABLET | Freq: Three times a day (TID) | ORAL | 1 refills | Status: DC | PRN

## 2024-10-07 ENCOUNTER — Encounter: Payer: Self-pay | Admitting: Family Medicine

## 2024-10-07 ENCOUNTER — Ambulatory Visit: Admitting: Family Medicine

## 2024-10-07 VITALS — BP 126/80 | HR 68 | Temp 98.0°F | Resp 16 | Ht 70.0 in

## 2024-10-07 DIAGNOSIS — F1120 Opioid dependence, uncomplicated: Secondary | ICD-10-CM

## 2024-10-07 MED ORDER — BUPRENORPHINE HCL-NALOXONE HCL 8-2 MG SL FILM: 1.0000 | ORAL_FILM | Freq: Every day | SUBLINGUAL | 0 refills | Status: AC

## 2024-10-07 NOTE — Patient Instructions (Signed)
 Let me know if/when there are issues.  Let us  know if you need anything.

## 2024-10-07 NOTE — Progress Notes (Signed)
 Chief Complaint  Patient presents with   Back Pain    Back Pain    Subjective: Patient is a 39 y.o. male here for follow-up.  Patient has a history of low back pain and anxiety that he was self-medicating with kratom for.  He got up to 400 mg daily.  He was prescribed Suboxone  which significantly helped him stop taking this and feel better overall.  He was seeing psychiatry who is helping her wean down further but he was unable to get his medicine filled in a timely fashion.  This ultimately led to him relapsing and he is currently on 40 mg of kratom again.  He is requesting refill of Suboxone  and wean down.  Previously had no adverse effects.  Past Medical History:  Diagnosis Date   Anxiety    Concussion    multiple from college wrestling, no residual   DEPRESSION    History of kidney stones    Migraines    Nephrolithiasis    PAC (premature atrial contraction)    no cardiology no treatment needed last pac few yrs ago    Objective: BP 126/80 (BP Location: Left Arm, Patient Position: Sitting)   Pulse 68   Temp 98 F (36.7 C) (Oral)   Resp 16   Ht 5' 10 (1.778 m)   SpO2 100%   BMI 28.47 kg/m  General: Awake, appears stated age MSK: No TTP, deformity.  Reasonable hamstring range of motion. Neuro: DTRs equal and symmetric lower extremities, Lungs: No accessory muscle use Psych: Age appropriate judgment and insight, normal affect and mood  Assessment and Plan: Uncomplicated opioid dependence (HCC) - Plan: Buprenorphine  HCl-Naloxone  HCl (SUBOXONE ) 8-2 MG FILM  Chronic issue that is not currently controlled. Suboxone  8-2 mg daily for 30 d, he will message a few d prior to needing a refill and we will hopefully be able to wean down. F/u as originally scheduled.  The patient voiced understanding and agreement to the plan.  Mabel [place], DO 10/07/24  2:00 PM
